# Patient Record
Sex: Female | Born: 1965 | Race: Black or African American | Hispanic: No | Marital: Married | Smoking: Never smoker
Health system: Southern US, Community
[De-identification: ages and names within clinical notes are randomized; demographics above are authoritative.]

## PROBLEM LIST (undated history)

## (undated) HISTORY — PX: SPINE SURGERY: SHX786

## (undated) HISTORY — PX: ABDOMINAL HYSTERECTOMY: SHX81

---

## 2003-11-28 ENCOUNTER — Other Ambulatory Visit: Payer: Self-pay

## 2004-09-21 ENCOUNTER — Ambulatory Visit: Payer: Self-pay | Admitting: Internal Medicine

## 2004-12-22 ENCOUNTER — Ambulatory Visit: Payer: Self-pay | Admitting: Internal Medicine

## 2005-03-26 ENCOUNTER — Ambulatory Visit: Payer: Self-pay | Admitting: Internal Medicine

## 2005-04-08 ENCOUNTER — Ambulatory Visit: Payer: Self-pay

## 2005-11-03 ENCOUNTER — Other Ambulatory Visit: Payer: Self-pay

## 2005-11-03 ENCOUNTER — Emergency Department: Payer: Self-pay | Admitting: Emergency Medicine

## 2005-11-10 ENCOUNTER — Ambulatory Visit: Payer: Self-pay | Admitting: Internal Medicine

## 2006-07-20 ENCOUNTER — Ambulatory Visit: Payer: Self-pay

## 2007-01-21 ENCOUNTER — Ambulatory Visit: Payer: Self-pay | Admitting: Internal Medicine

## 2007-02-14 ENCOUNTER — Emergency Department: Payer: Self-pay | Admitting: Internal Medicine

## 2007-03-17 ENCOUNTER — Emergency Department: Payer: Self-pay | Admitting: Emergency Medicine

## 2007-03-31 ENCOUNTER — Emergency Department: Payer: Self-pay | Admitting: Emergency Medicine

## 2007-04-01 ENCOUNTER — Emergency Department: Payer: Self-pay | Admitting: Emergency Medicine

## 2007-06-22 ENCOUNTER — Ambulatory Visit: Payer: Self-pay | Admitting: Internal Medicine

## 2007-11-03 ENCOUNTER — Ambulatory Visit: Payer: Self-pay | Admitting: Gastroenterology

## 2008-04-05 ENCOUNTER — Ambulatory Visit: Payer: Self-pay | Admitting: Internal Medicine

## 2008-04-14 ENCOUNTER — Ambulatory Visit: Payer: Self-pay | Admitting: Internal Medicine

## 2008-05-05 ENCOUNTER — Emergency Department: Payer: Self-pay | Admitting: Emergency Medicine

## 2008-05-11 ENCOUNTER — Ambulatory Visit: Payer: Self-pay | Admitting: Internal Medicine

## 2008-05-18 ENCOUNTER — Ambulatory Visit: Payer: Self-pay | Admitting: Internal Medicine

## 2008-06-01 ENCOUNTER — Ambulatory Visit: Payer: Self-pay | Admitting: Specialist

## 2008-09-29 ENCOUNTER — Ambulatory Visit: Payer: Self-pay | Admitting: Unknown Physician Specialty

## 2009-02-19 ENCOUNTER — Ambulatory Visit: Payer: Self-pay | Admitting: Internal Medicine

## 2009-09-21 LAB — HM MAMMOGRAPHY: HM Mammogram: NORMAL

## 2009-09-27 ENCOUNTER — Ambulatory Visit: Payer: Self-pay | Admitting: Internal Medicine

## 2009-10-11 ENCOUNTER — Ambulatory Visit: Payer: Self-pay | Admitting: Internal Medicine

## 2010-01-11 ENCOUNTER — Ambulatory Visit: Payer: Self-pay | Admitting: Family

## 2010-07-10 ENCOUNTER — Ambulatory Visit: Payer: Self-pay | Admitting: Internal Medicine

## 2010-10-25 ENCOUNTER — Emergency Department: Payer: Self-pay | Admitting: Emergency Medicine

## 2010-12-05 ENCOUNTER — Ambulatory Visit: Payer: Self-pay | Admitting: Internal Medicine

## 2011-09-25 ENCOUNTER — Telehealth: Payer: Self-pay | Admitting: Internal Medicine

## 2011-09-25 NOTE — Telephone Encounter (Signed)
See below made appointment for Tuesday 10/9 in case is could not be worked in earlier

## 2011-09-25 NOTE — Telephone Encounter (Signed)
Kelly [name] called wanted to be worked in.  Pain on right side kidney infection 2 weeks ago.  Just not feeling well  Please advise pt

## 2011-09-29 ENCOUNTER — Encounter: Payer: Self-pay | Admitting: Internal Medicine

## 2011-09-30 ENCOUNTER — Encounter: Payer: Self-pay | Admitting: Internal Medicine

## 2011-12-05 ENCOUNTER — Ambulatory Visit (INDEPENDENT_AMBULATORY_CARE_PROVIDER_SITE_OTHER): Payer: BC Managed Care – PPO | Admitting: Internal Medicine

## 2011-12-05 ENCOUNTER — Encounter: Payer: Self-pay | Admitting: Internal Medicine

## 2011-12-05 VITALS — BP 122/86 | HR 101 | Temp 98.1°F | Wt 242.0 lb

## 2011-12-05 DIAGNOSIS — J029 Acute pharyngitis, unspecified: Secondary | ICD-10-CM

## 2011-12-05 DIAGNOSIS — N390 Urinary tract infection, site not specified: Secondary | ICD-10-CM

## 2011-12-05 DIAGNOSIS — R52 Pain, unspecified: Secondary | ICD-10-CM

## 2011-12-05 LAB — POCT URINALYSIS DIPSTICK
Nitrite, UA: NEGATIVE
Protein, UA: NEGATIVE
Spec Grav, UA: 1.02
Urobilinogen, UA: 0.2

## 2011-12-05 LAB — POCT RAPID STREP A (OFFICE): Rapid Strep A Screen: NEGATIVE

## 2011-12-05 MED ORDER — CIPROFLOXACIN HCL 500 MG PO TABS: 500.0000 mg | ORAL_TABLET | Freq: Two times a day (BID) | ORAL | Status: AC

## 2011-12-05 NOTE — Progress Notes (Signed)
  Subjective:    Patient ID: Kelly [name], female    DOB: Dec 19, 1966, 45 y.o.   MRN: [id number]  HPI 45YO female presents for acute visit. Complains of several days of urinary frequency, urgency, hematuria, chills. Symptoms similar to previous UTI. Denies fever, flank pain.  Also notes 2-3 days of sore throat, nasal congestion, myalgia. 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 No fever, cough, dyspnea. Has not tried any medication for this.  Outpatient Encounter Prescriptions as of 12/05/2011  Medication Sig Dispense Refill  . lisinopril-hydrochlorothiazide (PRINZIDE,ZESTORETIC) 20-25 MG per tablet Take 1 tablet by mouth daily.        . meloxicam (MOBIC) 15 MG tablet Take 15 mg by mouth daily as needed.        Marland Kitchen spironolactone (ALDACTONE) 50 MG tablet Take 50 mg by mouth daily as needed.        . ciprofloxacin (CIPRO) 500 MG tablet Take 1 tablet (500 mg total) by mouth 2 (two) times daily.  28 tablet  0  . fluticasone (FLONASE) 50 MCG/ACT nasal spray Place 2 sprays into the nose daily.          Review of Systems  Constitutional: Positive for chills and fatigue. Negative for fever.  HENT: Positive for congestion and sore throat.   Respiratory: Negative for cough and shortness of breath.   Cardiovascular: Negative for chest pain.  Gastrointestinal: Negative for abdominal pain.  Genitourinary: Positive for dysuria, urgency, frequency and hematuria.  Musculoskeletal: Positive for myalgias.   BP 122/86  Pulse 101  Temp(Src) 98.1 F (36.7 C) (Oral)  Wt 242 lb (109.77 kg)  SpO2 97%     Objective:   Physical Exam  Constitutional: She is oriented to person, place, and time. She appears well-developed and well-nourished. No distress.  HENT:  Head: Normocephalic and atraumatic.  Right Ear: External ear normal.  Left Ear: External ear normal.  Nose: Nose normal.  Mouth/Throat: Oropharynx is clear and moist. No oropharyngeal exudate.  Eyes:  Conjunctivae are normal. Pupils are equal, round, and reactive to light. Right eye exhibits no discharge. Left eye exhibits no discharge. No scleral icterus.  Neck: Normal range of motion. Neck supple. No tracheal deviation present. No thyromegaly present.  Cardiovascular: Normal rate, regular rhythm, normal heart sounds and intact distal pulses.  Exam reveals no gallop and no friction rub.   No murmur heard. Pulmonary/Chest: Effort normal and breath sounds normal. No respiratory distress. She has no wheezes. She has no rales. She exhibits no tenderness.  Abdominal: There is CVA tenderness (right).  Musculoskeletal: Normal range of motion. She exhibits no edema and no tenderness.  Lymphadenopathy:    She has no cervical adenopathy.  Neurological: She is alert and oriented to person, place, and time. No cranial nerve deficit. She exhibits normal muscle tone. Coordination normal.  Skin: Skin is warm and dry. No rash noted. She is not diaphoretic. No erythema. No pallor.  Psychiatric: She has a normal mood and affect. Her behavior is normal. Judgment and thought content normal.          Assessment & Plan:  1. Urinary tract infection - UA pos for blood. Will send urine for culture. Start cipro 500mg  po bid and pyridium. Follow up if no improvement next 24 hr.  2. Sore Throat - Rapid strep neg. Flu neg. Suspect early viral URI. Will treat symptoms with prn tylenol and ibuprofen. Follow up if symptoms not improving.

## 2011-12-08 LAB — URINE CULTURE: Colony Count: 45000

## 2011-12-10 ENCOUNTER — Other Ambulatory Visit (INDEPENDENT_AMBULATORY_CARE_PROVIDER_SITE_OTHER): Payer: BC Managed Care – PPO | Admitting: *Deleted

## 2011-12-10 DIAGNOSIS — B958 Unspecified staphylococcus as the cause of diseases classified elsewhere: Secondary | ICD-10-CM

## 2011-12-10 DIAGNOSIS — N390 Urinary tract infection, site not specified: Secondary | ICD-10-CM

## 2011-12-10 LAB — POCT URINALYSIS DIPSTICK
Glucose, UA: NEGATIVE
Leukocytes, UA: NEGATIVE
Nitrite, UA: NEGATIVE
Urobilinogen, UA: 0.2

## 2011-12-11 LAB — CBC WITH DIFFERENTIAL/PLATELET
Eosinophils Absolute: 0.1 10*3/uL (ref 0.0–0.7)
Hemoglobin: 12.9 g/dL (ref 12.0–15.0)
Lymphocytes Relative: 36 % (ref 12–46)
Lymphs Abs: 2.8 10*3/uL (ref 0.7–4.0)
Neutro Abs: 4.5 10*3/uL (ref 1.7–7.7)
Neutrophils Relative %: 59 % (ref 43–77)
Platelets: 195 10*3/uL (ref 150–400)
RBC: 4.45 MIL/uL (ref 3.87–5.11)
WBC: 7.6 10*3/uL (ref 4.0–10.5)

## 2011-12-12 LAB — URINE CULTURE: Organism ID, Bacteria: NO GROWTH

## 2011-12-17 LAB — CULTURE, BLOOD (SINGLE): Organism ID, Bacteria: NO GROWTH

## 2011-12-30 ENCOUNTER — Encounter: Payer: Self-pay | Admitting: Internal Medicine

## 2011-12-30 ENCOUNTER — Ambulatory Visit (INDEPENDENT_AMBULATORY_CARE_PROVIDER_SITE_OTHER): Payer: BC Managed Care – PPO | Admitting: Internal Medicine

## 2011-12-30 VITALS — BP 142/100 | HR 80 | Temp 98.6°F | Wt 243.0 lb

## 2011-12-30 DIAGNOSIS — E669 Obesity, unspecified: Secondary | ICD-10-CM

## 2011-12-30 DIAGNOSIS — M545 Low back pain, unspecified: Secondary | ICD-10-CM

## 2011-12-30 DIAGNOSIS — M549 Dorsalgia, unspecified: Secondary | ICD-10-CM

## 2011-12-30 MED ORDER — TRAMADOL HCL 50 MG PO TABS: 50.0000 mg | ORAL_TABLET | Freq: Four times a day (QID) | ORAL | Status: DC | PRN

## 2011-12-30 MED ORDER — HYDROCODONE-ACETAMINOPHEN 10-660 MG PO TABS: 1.0000 | ORAL_TABLET | Freq: Every evening | ORAL | Status: DC | PRN

## 2011-12-30 MED ORDER — PREDNISONE (PAK) 10 MG PO TABS: ORAL_TABLET | ORAL | Status: DC

## 2011-12-30 NOTE — Progress Notes (Signed)
Subjective:    Patient ID: Kelly [name], female    DOB: 21-Jan-1966, 46 y.o.   MRN: [id number]  HPI  Kelly [name] is a 46 yo AA female with a history of hip pain that occurred last summer after a change in physical activity who presents with sudden onset of back pain since last FRiday after no particularly new activities , travel or new shoes.  The pain is in her lower back and radiates to both thighs to the knees but not below.  She has been alternating between use of mobic and tynelol alternating w/o much relief.  She notes tailbone numbness but no saddle numbness or stool/fecal  incontinence.  Her previous episode of pain in July resulted in an Orthopedics evaluation (she saw Dedra Skeens at [facility] ) who opined after obtaining hip and lumbar spine films  that her left hip pain was referred from her lower back . Pain is aggravated by prlonged standing and sitting.  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  No prior PT.     Review of Systems  Constitutional: Negative for fever, chills, appetite change, fatigue and unexpected weight change.  HENT: Negative for hearing loss, ear pain, nosebleeds, congestion, sore throat, facial swelling, rhinorrhea, sneezing, mouth sores, trouble swallowing, neck pain, neck stiffness, voice change, postnasal drip, sinus pressure, tinnitus and ear discharge.   Eyes: Negative for pain, discharge, redness and visual disturbance.  Respiratory: Negative for cough, chest tightness, shortness of breath, wheezing and stridor.   Cardiovascular: Negative for chest pain, palpitations and leg swelling.  Gastrointestinal: Negative for nausea, vomiting, abdominal pain, diarrhea, constipation, blood in stool, abdominal distention and anal bleeding.  Genitourinary: Negative for dysuria and flank pain.  Musculoskeletal: Positive for back pain. Negative for myalgias, arthralgias and gait problem.  Skin: Negative for color change  and rash.  Neurological: Negative for dizziness, weakness, light-headedness and headaches.  Hematological: Negative for adenopathy. Does not bruise/bleed easily.  Psychiatric/Behavioral: Negative for suicidal ideas, sleep disturbance and dysphoric mood. The patient is not nervous/anxious.        Objective:   Physical Exam  Constitutional: She is oriented to person, place, and time. She appears well-developed and well-nourished.  HENT:  Mouth/Throat: Oropharynx is clear and moist.  Eyes: EOM are normal. Pupils are equal, round, and reactive to light. No scleral icterus.  Neck: Normal range of motion. Neck supple. No JVD present. No thyromegaly present.  Cardiovascular: Normal rate, regular rhythm, normal heart sounds and intact distal pulses.   Pulmonary/Chest: Effort normal and breath sounds normal.  Abdominal: Soft. Bowel sounds are normal. She exhibits no mass. There is no tenderness.  Musculoskeletal: Normal range of motion. She exhibits no edema.  Lymphadenopathy:    She has no cervical adenopathy.  Neurological: She is alert and oriented to person, place, and time. She has normal strength. She displays abnormal reflex. No cranial nerve deficit or sensory deficit. She displays a negative Romberg sign. She displays no Babinski's sign on the right side. She displays no Babinski's sign on the left side.  Reflex Scores:      Patellar reflexes are 3+ on the right side and 3+ on the left side.      Achilles reflexes are 1+ on the right side and 1+ on the left side.      Normal straight leg lift bilateral except for tightness in the hamstrings  Skin: Skin is warm and dry.  Psychiatric: She has  a normal mood and affect.          Assessment & Plan:

## 2011-12-30 NOTE — Patient Instructions (Signed)
I am prescribing tramadol 50 mg every 6 hours for daytime use ,  Suspend  the meloxicam for one week  But continue tylenol .    tyllnol  Should be limited to 2000 mg daily   Use the vicodin   In the evening.  Prednisone taper  60 mg then 50 mg then 40 mg , etc  Physical therapy ordered .

## 2011-12-31 ENCOUNTER — Encounter: Payer: Self-pay | Admitting: Internal Medicine

## 2011-12-31 DIAGNOSIS — E669 Obesity, unspecified: Secondary | ICD-10-CM | POA: Insufficient documentation

## 2011-12-31 DIAGNOSIS — M545 Low back pain, unspecified: Secondary | ICD-10-CM | POA: Insufficient documentation

## 2011-12-31 DIAGNOSIS — G8929 Other chronic pain: Secondary | ICD-10-CM | POA: Insufficient documentation

## 2011-12-31 NOTE — Assessment & Plan Note (Signed)
Her exam is not consistent with sciatica and does not warrant MRI at this time.  Will treat for strain with prednisone, MR and analgesics and initiate PT referral .  If no improvement in one week with meds and alteration of duties (work note written),  will consider MRI but will need to get prior films reports from [place].

## 2012-01-06 ENCOUNTER — Encounter: Payer: Self-pay | Admitting: Internal Medicine

## 2012-01-06 ENCOUNTER — Other Ambulatory Visit: Payer: Self-pay | Admitting: Internal Medicine

## 2012-01-06 ENCOUNTER — Telehealth: Payer: Self-pay | Admitting: *Deleted

## 2012-01-06 NOTE — Telephone Encounter (Signed)
I would like to see her tomorrow. Can you put her on the schdeule

## 2012-01-06 NOTE — Telephone Encounter (Signed)
Appt made for tomorrow.

## 2012-01-06 NOTE — Telephone Encounter (Signed)
Pt states that her back is not any better and she would like to have her work leave extended one more week, she is supposed to go back tomorrow but needs more time.

## 2012-01-06 NOTE — Telephone Encounter (Signed)
Left message on machine for pt to call back.

## 2012-01-07 ENCOUNTER — Ambulatory Visit (INDEPENDENT_AMBULATORY_CARE_PROVIDER_SITE_OTHER): Payer: BC Managed Care – PPO | Admitting: Internal Medicine

## 2012-01-07 ENCOUNTER — Encounter: Payer: Self-pay | Admitting: Internal Medicine

## 2012-01-07 ENCOUNTER — Telehealth: Payer: Self-pay | Admitting: *Deleted

## 2012-01-07 VITALS — BP 126/90 | HR 84 | Temp 98.4°F | Wt 242.0 lb

## 2012-01-07 DIAGNOSIS — M5430 Sciatica, unspecified side: Secondary | ICD-10-CM

## 2012-01-07 DIAGNOSIS — M545 Low back pain, unspecified: Secondary | ICD-10-CM

## 2012-01-07 DIAGNOSIS — M5440 Lumbago with sciatica, unspecified side: Secondary | ICD-10-CM

## 2012-01-07 DIAGNOSIS — M5432 Sciatica, left side: Secondary | ICD-10-CM

## 2012-01-07 MED ORDER — PREDNISONE (PAK) 10 MG PO TABS: ORAL_TABLET | ORAL | Status: AC

## 2012-01-07 MED ORDER — HYDROCODONE-ACETAMINOPHEN 10-660 MG PO TABS: 1.0000 | ORAL_TABLET | Freq: Every evening | ORAL | Status: DC | PRN

## 2012-01-07 MED ORDER — HYDROCODONE-ACETAMINOPHEN 10-660 MG PO TABS: 1.0000 | ORAL_TABLET | Freq: Three times a day (TID) | ORAL | Status: DC | PRN

## 2012-01-07 MED ORDER — TRAMADOL HCL 50 MG PO TABS: 100.0000 mg | ORAL_TABLET | Freq: Four times a day (QID) | ORAL | Status: AC | PRN

## 2012-01-07 NOTE — Telephone Encounter (Signed)
[facility] called, they dont have vicodin 10/660 in stock and are asking if ok to change to 10/650.  OK per Dr. Darrick Huntsman.

## 2012-01-07 NOTE — Progress Notes (Signed)
Subjective:    Patient ID: Kelly [name], female    DOB: [birth year], 46 y.o.   MRN: [id number]  HPI  Kelly [name] returns for follow up on low back pain radiating to left buttock and thigh.  She has stayed out of work abut has been moving around taking medications for the past week and pain is no longer radiating down her leg but still present and radiating to the left thigh,  She has started physical therapy.   Her vicodin prescription was apparently never called in hor her so she has been in considerable pain.  She describes her pain level now as 8/10. 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   Past Medical History  Diagnosis Date  . Anemia     severe due to menorrhagia, s/p hysterectomy  . Chest pain 2002    abnormal EKG, negative treadmill stress test/Holter myoview, Dr. Welton Flakes  . Obesity   . Obstructive sleep apnea 2009    mild, wears CPAP  . Preeclampsia     delivered twins at 35 weeks   Current Outpatient Prescriptions on File Prior to Visit  Medication Sig Dispense Refill  . fluticasone (FLONASE) 50 MCG/ACT nasal spray Place 2 sprays into the nose daily.        Marland Kitchen lisinopril-hydrochlorothiazide (PRINZIDE,ZESTORETIC) 20-25 MG per tablet TAKE 1 TABLET BY MOUTH EVERY DAY BLOOD PRESSURE  30 tablet  2  . meloxicam (MOBIC) 15 MG tablet Take 15 mg by mouth daily as needed.        Marland Kitchen spironolactone (ALDACTONE) 50 MG tablet Take 50 mg by mouth daily as needed.          Review of Systems  Constitutional: Negative for fever, chills, appetite change, fatigue and unexpected weight change.  HENT: Negative for hearing loss, ear pain, nosebleeds, congestion, sore throat, facial swelling, rhinorrhea, sneezing, mouth sores, trouble swallowing, neck pain, neck stiffness, voice change, postnasal drip, sinus pressure, tinnitus and ear discharge.   Eyes: Negative for pain, discharge, redness and visual disturbance.  Respiratory: Negative for  cough, chest tightness, shortness of breath, wheezing and stridor.   Cardiovascular: Negative for chest pain, palpitations and leg swelling.  Gastrointestinal: Negative for nausea, vomiting, abdominal pain, diarrhea, constipation, blood in stool, abdominal distention and anal bleeding.  Genitourinary: Negative for dysuria and flank pain.  Musculoskeletal: Positive for back pain. Negative for myalgias, arthralgias and gait problem.  Skin: Negative for color change and rash.  Neurological: Negative for dizziness, weakness, light-headedness and headaches.  Hematological: Negative for adenopathy. Does not bruise/bleed easily.  Psychiatric/Behavioral: Negative for suicidal ideas, sleep disturbance and dysphoric mood. The patient is not nervous/anxious.    BP 126/90  Pulse 84  Temp(Src) 98.4 F (36.9 C) (Oral)  Wt 242 lb (109.77 kg)  SpO2 99%     Objective:   Physical Exam  Constitutional: She is oriented to person, place, and time. She appears well-developed and well-nourished.  HENT:  Mouth/Throat: Oropharynx is clear and moist.  Eyes: EOM are normal. Pupils are equal, round, and reactive to light. No scleral icterus.  Neck: Normal range of motion. Neck supple. No JVD present. No thyromegaly present.  Cardiovascular: Normal rate, regular rhythm, normal heart sounds and intact distal pulses.   Pulmonary/Chest: Effort normal and breath sounds normal.  Abdominal: Soft. Bowel sounds are normal. She exhibits no mass. There is no tenderness.  Musculoskeletal: Normal range of motion. She exhibits no edema.  Lymphadenopathy:  She has no cervical adenopathy.  Neurological: She is alert and oriented to person, place, and time. She has normal strength. She displays abnormal reflex. No cranial nerve deficit or sensory deficit. She displays a negative Romberg sign. She displays no Babinski's sign on the right side. She displays no Babinski's sign on the left side.  Reflex Scores:      Patellar  reflexes are 3+ on the right side and 3+ on the left side.      Achilles reflexes are 1+ on the right side and 1+ on the left side.      Normal straight leg lift bilateral except for tightness in the hamstrings  Skin: Skin is warm and dry.  Psychiatric: She has a normal mood and affect.       Assessment & Plan:

## 2012-01-07 NOTE — Patient Instructions (Signed)
Your urine and blood tests were all normal   I am calling in ES vicodin for the next few weeks,  You may use up to 3 daily,  And tramadol to use when your are driving ,  Up to 161 mg at a time on the tramadol.  We will also repeat the prednisone taper.,

## 2012-01-08 DIAGNOSIS — M5432 Sciatica, left side: Secondary | ICD-10-CM | POA: Insufficient documentation

## 2012-01-08 NOTE — Assessment & Plan Note (Signed)
With mild improvement following a course of prednisone with regard to radicular symptoms. 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  Continue PT,  repeat prednisone taper,  Add vicodin for severe or nighttime pain and continue tramadol for daytime pain .

## 2012-01-12 ENCOUNTER — Ambulatory Visit: Payer: BC Managed Care – PPO | Admitting: Internal Medicine

## 2012-01-14 ENCOUNTER — Encounter: Payer: Self-pay | Admitting: Internal Medicine

## 2012-01-19 ENCOUNTER — Telehealth: Payer: Self-pay | Admitting: Internal Medicine

## 2012-01-19 NOTE — Telephone Encounter (Signed)
LMOM for patient to call back.

## 2012-01-19 NOTE — Telephone Encounter (Signed)
Patient is needing a note to return to work.

## 2012-01-20 NOTE — Telephone Encounter (Signed)
Pt is asking for note to go back to work.  She went back on 1/24, so needs note to have that date.  Please call when ready, she is ok to wait until Thursday.  Please call her on fax number [phone].

## 2012-01-23 ENCOUNTER — Encounter: Payer: Self-pay | Admitting: Internal Medicine

## 2012-03-12 NOTE — Telephone Encounter (Signed)
Morrie Sheldon, can you check on this?  Thanks.

## 2012-03-12 NOTE — Telephone Encounter (Signed)
This has been taken care of patient go the note when she had requested it.

## 2012-03-25 ENCOUNTER — Telehealth: Payer: Self-pay | Admitting: *Deleted

## 2012-03-25 ENCOUNTER — Emergency Department: Payer: Self-pay | Admitting: *Deleted

## 2012-03-25 ENCOUNTER — Telehealth: Payer: Self-pay | Admitting: Internal Medicine

## 2012-03-25 LAB — COMPREHENSIVE METABOLIC PANEL
Alkaline Phosphatase: 61 U/L (ref 50–136)
BUN: 8 mg/dL (ref 7–18)
Calcium, Total: 8.8 mg/dL (ref 8.5–10.1)
Chloride: 104 mmol/L (ref 98–107)
Co2: 27 mmol/L (ref 21–32)
Creatinine: 0.65 mg/dL (ref 0.60–1.30)
EGFR (African American): >60
Glucose: 98 mg/dL (ref 65–99)
Osmolality: 280 (ref 275–301)
SGPT (ALT): 25 U/L
Sodium: 141 mmol/L (ref 136–145)

## 2012-03-25 LAB — CBC
MCHC: 33.4 g/dL (ref 32.0–36.0)
MCV: 87 fL (ref 80–100)
WBC: 6 10*3/uL (ref 3.6–11.0)

## 2012-03-25 LAB — LIPASE, BLOOD: Lipase: 125 U/L (ref 73–393)

## 2012-03-25 NOTE — Telephone Encounter (Signed)
[facility] Triage Call Report Triage Record Num: [id number] Operator: Patriciaann Clan Patient Name: Kelly [name] Call Date & Time: 03/25/2012 10:51:23AM Patient Phone: [phone] PCP: Duncan Dull Patient Gender: Female PCP Fax : [phone] Patient DOB: 1966/10/27 Practice Name: [facility] Reason for Call: Caller: [name]/Patient; PCP: Duncan Dull; CB#: [phone]; LMP- hysterectomy Patient states she developed "heart burn." Onset X 1 month. States sx have increased X 1 week. States has been using OTC Maalox. States, "even drinking water hurts." C/o nausea. No vomiting or diarrhea. Afebrile. Denies chest pain or shortness of breath. States burning/pain in upper abdomen radiating to RUQ of abdomen. States abdominal pain started 03/21/12. States pain increases after eating. States pain is currently at "8" on 1-10 scale and describes as "sharp, stabbing." Patient also describes pain as "tearing." Triage per Abdominal Pain Protocol. No appts. available in office. Care advice given per guidelines. Patient advised NPO, advised to be seen in ED for evaluation now. Patient advised not to drive self. Patient verbalizes understanding and agreeable. Patient states she will go to [facility] ED for evaluation. Sarah RN, in office, notified of above. Protocol(s) Used: Abdominal Pain Protocol(s) Used: Heartburn Recommended Outcome per Protocol: See ED Immediately Reason for Outcome: Abdominal pain Pain described as deep, boring, or tearing Care Advice: ~ Another adult should drive. ~ Do not give the patient anything to eat or drink. ~ Do not push on abdomen. ~ IMMEDIATE ACTION Write down provider's name. List or place the following in a bag for transport with the patient: current prescription and/or nonprescription medications; alternative treatments, therapies and medications; and street drugs. ~ 03/25/2012 11:23:52AM Page 1 of 1 [id number]  I seen  another note in patients chart that Maralyn Sago spoke with [facility] and patient is going to ER.

## 2012-03-25 NOTE — Telephone Encounter (Signed)
Spoke w/[facility] - FYI, they sent pt to the ER. She is c/o severe (8 out of 10) "tearing" pain in RUQ & epigastric region w/nausea. No vomiting or fever. Symptoms increasing over the last week, worse w/food and now can not tolerate liquids. I agreed with ER due to severe pain and no open apts at this time.

## 2012-03-25 NOTE — Telephone Encounter (Signed)
Caller: Kylyn/Patient; PCP: Duncan Dull; CB#: [phone]; LMP- hysterectomy  Patient states she developed "heart burn." Onset X 1 month. States sx have increased X 1 week. States has been using OTC Maalox. States, "even drinking water hurts." C/o nausea.  No vomiting or diarrhea. Afebrile. Denies chest pain or shortness of breath. States burning/pain  in upper abdomen radiating to RUQ of abdomen. States abdominal pain started 03/21/12. States pain increases after eating. States pain is currently at "8" on 1-10 scale and describes as "sharp, stabbing."  Patient also describes pain as "tearing."  Triage per Abdominal Pain Protocol. No appts. available in office. Care advice given per guidelines. Patient advised NPO, advised to be seen in ED for evaluation now. Patient advised not to  drive self. Patient verbalizes understanding and agreeable. Patient states she will go to [facility] ED for evaluation. Sarah RN, in office, notified of above.

## 2012-06-25 ENCOUNTER — Other Ambulatory Visit: Payer: Self-pay | Admitting: Internal Medicine

## 2012-07-22 ENCOUNTER — Telehealth: Payer: Self-pay | Admitting: Internal Medicine

## 2012-07-22 NOTE — Telephone Encounter (Signed)
Caller: Kelly [name]/Patient; PCP: Duncan Dull; CB#: [phone]; ; ; Call regarding Headache. Eyes Swollen, Nose Irritation;  Afebrile. Onset- 2 weeks for eye s/s and  1 week for headache per pt. Pt c/o of headache and redness, tenderness, swelling at the corner of her eye. She reports it looks like a sore or scabbing in the area.  She also has tearing and sensitivity to light. Emergent s/s of Headache and Eye infection or irritation protocol r/o. Pt to see provider within 4hrs. No appts seen in [facility], message sent.

## 2012-07-22 NOTE — Telephone Encounter (Signed)
Patient notified

## 2012-07-22 NOTE — Telephone Encounter (Signed)
Unless dr walker can see her or if i have room tomorrow , she needs to go to urgent care.

## 2012-07-27 ENCOUNTER — Ambulatory Visit (INDEPENDENT_AMBULATORY_CARE_PROVIDER_SITE_OTHER): Payer: BC Managed Care – PPO | Admitting: Internal Medicine

## 2012-07-27 ENCOUNTER — Encounter: Payer: Self-pay | Admitting: Internal Medicine

## 2012-07-27 VITALS — BP 136/84 | HR 100 | Temp 98.9°F | Resp 16 | Wt 253.5 lb

## 2012-07-27 DIAGNOSIS — F339 Major depressive disorder, recurrent, unspecified: Secondary | ICD-10-CM | POA: Insufficient documentation

## 2012-07-27 DIAGNOSIS — R635 Abnormal weight gain: Secondary | ICD-10-CM

## 2012-07-27 DIAGNOSIS — H1045 Other chronic allergic conjunctivitis: Secondary | ICD-10-CM

## 2012-07-27 DIAGNOSIS — G43009 Migraine without aura, not intractable, without status migrainosus: Secondary | ICD-10-CM

## 2012-07-27 DIAGNOSIS — F329 Major depressive disorder, single episode, unspecified: Secondary | ICD-10-CM

## 2012-07-27 DIAGNOSIS — R5383 Other fatigue: Secondary | ICD-10-CM

## 2012-07-27 DIAGNOSIS — F32A Depression, unspecified: Secondary | ICD-10-CM

## 2012-07-27 DIAGNOSIS — E669 Obesity, unspecified: Secondary | ICD-10-CM

## 2012-07-27 DIAGNOSIS — F3289 Other specified depressive episodes: Secondary | ICD-10-CM

## 2012-07-27 DIAGNOSIS — R5381 Other malaise: Secondary | ICD-10-CM

## 2012-07-27 DIAGNOSIS — H1013 Acute atopic conjunctivitis, bilateral: Secondary | ICD-10-CM

## 2012-07-27 LAB — COMPREHENSIVE METABOLIC PANEL
Alkaline Phosphatase: 51 U/L (ref 39–117)
BUN: 11 mg/dL (ref 6–23)
CO2: 24 mEq/L (ref 19–32)
Creatinine, Ser: 0.5 mg/dL (ref 0.4–1.2)
GFR: 174.84 mL/min (ref >60.00)
Glucose, Bld: 111 mg/dL — ABNORMAL HIGH (ref 70–99)
Total Bilirubin: 0.5 mg/dL (ref 0.3–1.2)
Total Protein: 7.5 g/dL (ref 6.0–8.3)

## 2012-07-27 LAB — CBC WITH DIFFERENTIAL/PLATELET
Basophils Relative: 0.3 % (ref 0.0–3.0)
Eosinophils Relative: 1.2 % (ref 0.0–5.0)
HCT: 38 % (ref 36.0–46.0)
Hemoglobin: 12.7 g/dL (ref 12.0–15.0)
Lymphs Abs: 2.7 10*3/uL (ref 0.7–4.0)
MCV: 87.5 fl (ref 78.0–100.0)
Monocytes Absolute: 0.4 10*3/uL (ref 0.1–1.0)
Monocytes Relative: 6 % (ref 3.0–12.0)
Neutro Abs: 4 10*3/uL (ref 1.4–7.7)
Platelets: 168 10*3/uL (ref 150.0–400.0)
WBC: 7.2 10*3/uL (ref 4.5–10.5)

## 2012-07-27 LAB — LIPID PANEL
Cholesterol: 186 mg/dL (ref 0–200)
VLDL: 36.6 mg/dL (ref 0.0–40.0)

## 2012-07-27 LAB — TSH: TSH: 1.05 u[IU]/mL (ref 0.35–5.50)

## 2012-07-27 MED ORDER — NABUMETONE 750 MG PO TABS: 750.0000 mg | ORAL_TABLET | Freq: Two times a day (BID) | ORAL | Status: DC

## 2012-07-27 MED ORDER — CITALOPRAM HYDROBROMIDE 20 MG PO TABS: 20.0000 mg | ORAL_TABLET | Freq: Every day | ORAL | Status: DC

## 2012-07-27 NOTE — Assessment & Plan Note (Signed)
Trouble with anxiety in this insomnia aggravated by the loss of her daughter to college and her frustration with her weight. Discussed trial of citalopram and return in one month.

## 2012-07-27 NOTE — Assessment & Plan Note (Signed)
Likely aggravated by depression and insomnia. We have elected not to start any new medications other than the citalopram and an anti-inflammatory for her muscle aches.

## 2012-07-27 NOTE — Progress Notes (Signed)
Patient ID: Kelly [name], female   DOB: December 31, 1965, 46 y.o.   MRN: [id number]  Patient Active Problem List  Diagnosis  . Obesity (BMI 30-39.9)  . Sciatica of left side  . Depression (emotion)  . Migraine headache without aura  . Allergic conjunctivitis of both eyes    Subjective:  CC:   Chief Complaint  Patient presents with  . Facial Swelling  . Headache    HPI:   Kelly [name] a 46 y.o. female who presents with 1) Weight gain, despite eating frequent smaller meals . Husband sabotaging her attempts to lose weight by bringing home donuts .  Kneesand  hips ache with exercise, but has not tried swimming or water aerobics yet.  She is also reporting symptoms of muscle pain if grabbed.  2) Tearful,  dtr just went to college and she is having empry nest syndrome.  Mood swings from happy to sad.  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  Her migraines Started 10 yrs ago after hysterectomy but weret infrequent up until 2 months ago. She denies visual changes blurred vision nausea neck pain fevers and chills. 4) bilateral itching of both eyes without discharge or vision changes.   Past Medical History  Diagnosis Date  . Anemia     severe due to menorrhagia, s/p hysterectomy  . Chest pain 2002    abnormal EKG, negative treadmill stress test/Holter myoview, Dr. Welton Flakes  . Obesity   . Obstructive sleep apnea 2009    mild, wears CPAP  . Preeclampsia     delivered twins at 35 weeks    Past Surgical History  Procedure Date  . Abdominal hysterectomy 35    secondary to post partum hemorrhage causing anemia         The following portions of the patient's history were reviewed and updated as appropriate: Allergies, current medications, and problem list.    Review of Systems:   12 Pt  review of systems was negative except those addressed in the HPI,     History   Social History  . Marital Status:  Married    Spouse Name: N/A    Number of Children: N/A  . Years of Education: N/A   Occupational History  . Part-time     Midwife   Social History Main Topics  . Smoking status: Never Smoker   . Smokeless tobacco: Never Used  . Alcohol Use: Yes     occasional  . Drug Use: No  . Sexually Active: Not on file   Other Topics Concern  . Not on file   Social History Narrative  . No narrative on file    Objective:  BP 136/84  Pulse 100  Temp 98.9 F (37.2 C) (Oral)  Resp 16  Wt 253 lb 8 oz (114.987 kg)  SpO2 98%  General appearance: alert, cooperative and appears stated age Ears: normal TM's and external ear canals both ears Throat: lips, mucosa, and tongue normal; teeth and gums normal Neck: no adenopathy, no carotid bruit, supple, symmetrical, trachea midline and thyroid not enlarged, symmetric, no tenderness/mass/nodules Back: symmetric, no curvature. ROM normal. No CVA tenderness. Lungs: clear to auscultation bilaterally Heart: regular rate and rhythm, S1, S2 normal, no murmur, click, rub or gallop Abdomen: soft, non-tender; bowel sounds normal; no masses,  no organomegaly Pulses: 2+ and symmetric Skin: Skin color, texture, turgor normal. No rashes or lesions Lymph nodes: Cervical, supraclavicular, and axillary nodes  normal.  Assessment and Plan:  Depression (emotion) Trouble with anxiety in this insomnia aggravated by the loss of her daughter to college and her frustration with her weight. Discussed trial of citalopram and return in one month.  Obesity (BMI 30-39.9) I have addressed  BMI and recommended a low glycemic index diet utilizing smaller more frequent meals to increase metabolism.  I have also recommended that patient start exercising with a goal of 30 minutes of aerobic exercise a minimum of 5 days per week. Screening for lipid disorders, thyroid and diabetes to be done prior to next visit in one month   Migraine headache without aura Likely  aggravated by depression and insomnia. We have elected not to start any new medications other than the citalopram and an anti-inflammatory for her muscle aches.  Allergic conjunctivitis of both eyes Discussed initial trial of Zyrtec or Allegra. If her symptoms are not lead relieved with over-the-counter antihistamines we will try Pataday drops   Updated Medication List Outpatient Encounter Prescriptions as of 07/27/2012  Medication Sig Dispense Refill  . lisinopril-hydrochlorothiazide (PRINZIDE,ZESTORETIC) 20-25 MG per tablet TAKE 1 TABLET BY MOUTH EVERY DAY BLOOD PRESSURE  30 tablet  3  . DISCONTD: meloxicam (MOBIC) 15 MG tablet Take 15 mg by mouth daily as needed.        . citalopram (CELEXA) 20 MG tablet Take 1 tablet (20 mg total) by mouth daily.  30 tablet  3  . nabumetone (RELAFEN) 750 MG tablet Take 1 tablet (750 mg total) by mouth 2 (two) times daily.  60 tablet  1  . DISCONTD: fluticasone (FLONASE) 50 MCG/ACT nasal spray Place 2 sprays into the nose daily.        Marland Kitchen DISCONTD: Hydrocodone-Acetaminophen 10-660 MG TABS Take 1 tablet by mouth 3 (three) times daily as needed.  90 each  0  . DISCONTD: spironolactone (ALDACTONE) 50 MG tablet Take 50 mg by mouth daily as needed.           Orders Placed This Encounter  Procedures  . Lipid panel  . TSH  . CBC with Differential  . Comprehensive metabolic panel    Return in about 1 month (around 08/27/2012).

## 2012-07-27 NOTE — Patient Instructions (Addendum)
Try Allegra or xyrtec for the itchy eyes,.  If no results, call for eye drops   I am changing your melxociam to nabumetone,  Take twice daily as needed for pain.  [facility] consider [facility] to use [facility] for exercise.   I am starting you on citlaopram for depression and anxiety .  Start with 1/2 tablet daily with dinner ,  Increase to whole tablet after one week.  Try citalopram for the insomnia. If no improvement in 1-2 weeks,  Call for ambien and change the citalopram to earlier day dosing.   Consider a Low Glycemic Index Diet and eating 6 smaller meals daily .  This frequent feeding stimulates your metabolism and the lower glycemic index foods will lower your blood sugars:   This is an example of my daily  "Low GI"  Diet:  All of the foods can be found at grocery stores and in bulk at [employer]  club   7 AM Breakfast:  Low carbohydrate Protein  Shakes (I recommend the EAS AdvantEdge "Carb Control" shakes  Or the low carb shakes by Atkins.   Both are available everywhere:  In  cases at [employer]  Or in 4 packs at grocery stores and pharmacies  2.5 carbs  (Alternative is  a toasted Arnold's Sandwhich Thin w/ peanut butter, a "Bagel Thin" with cream cheese and salmon) or  a scrambled egg burrito made with a low carb tortilla .  Avoid cereal and bananas, oatmeal too unless the old fashioned kind that takes 30-40 minutes to prepare.  the rest is overly processed, has minimal fiber, and loaded with carbohydrates!   10 AM: Protein bar by Atkins (the snack size, under 200 cal.  There are many varieties , available widely again or in bulk in limited varieties at [employer])  Other so called "protein bars" tend to be loaded with carbohydrates.  Remember, in food advertising, the word "energy" is synonymous for " carbohydrate."  Lunch: sandwich of [place], (or any lunchmeat or canned tuna), fresh avocado and cheese on a lower carbohydrate pita bread, flatbread, or tortilla . Ok to use mayonnaise. The bread is the only  source or carbohydrate that can be decreased (Joseph's makes a pita bread and a flat bread  Are 50 cal and 4 net carbs ; Toufayan makes a low carb flatbread 100 cal and 9 net carbs ("food Lion)   and  Mission makes a low carb whole wheat tortilla  210 cal and 6 net carbs)  3 PM:  Mid day :  Another protein bar,  Or a  cheese stick (100 cal, 0 carbs),  Or 1 ounce of  almonds, walnuts, pistachios, pecans, peanuts,  Macadamia nuts. Or a Dannon light n Fit greek yogurt, 80 cal 8 net carbs . Avoid "granola"; the dried cranberries and raisins are loaded with carbohydrates.    6 PM  Dinner:  "mean and green:"  Meat/chicken/fish or a high protein legume; , with a green salad, and a low GI  Veggie (broccoli, cauliflower, green beans, spinach, brussel sprouts. Lima beans) : Avoid "Low fat dressings, Reyne Dumas and [address]! They are loaded with sugar! Instead use ranch, vinagrette,  Blue cheese, etc  9 PM snack : Breyer's "low carb" fudgsicle or  ice cream bar (Carb Smart line), or  Weight Watcher's ice cream bar , or anouther "no sugar added" ice cream; or another protein shake or a serving of fresh fruit with whipped cream (Avoid bananas, pineapple, grapes  and  watermelon on a regular basis because they are high in sugar)   Remember that snack Substitutions should be less than 15 to 20 carbs  Per serving. Remember to subtract fiber grams to get the "net carbs."

## 2012-07-27 NOTE — Assessment & Plan Note (Signed)
I have addressed  BMI and recommended a low glycemic index diet utilizing smaller more frequent meals to increase metabolism.  I have also recommended that patient start exercising with a goal of 30 minutes of aerobic exercise a minimum of 5 days per week. Screening for lipid disorders, thyroid and diabetes to be done prior to next visit in one month

## 2012-07-27 NOTE — Assessment & Plan Note (Signed)
Discussed initial trial of Zyrtec or Allegra. If her symptoms are not lead relieved with over-the-counter antihistamines we will try Pataday drops

## 2012-09-13 ENCOUNTER — Ambulatory Visit (INDEPENDENT_AMBULATORY_CARE_PROVIDER_SITE_OTHER): Payer: BC Managed Care – PPO | Admitting: Internal Medicine

## 2012-09-13 ENCOUNTER — Other Ambulatory Visit: Payer: Self-pay | Admitting: Internal Medicine

## 2012-09-13 ENCOUNTER — Encounter: Payer: Self-pay | Admitting: Internal Medicine

## 2012-09-13 VITALS — BP 132/74 | HR 80 | Temp 98.5°F | Ht 67.0 in | Wt 243.2 lb

## 2012-09-13 DIAGNOSIS — F3289 Other specified depressive episodes: Secondary | ICD-10-CM

## 2012-09-13 DIAGNOSIS — N898 Other specified noninflammatory disorders of vagina: Secondary | ICD-10-CM

## 2012-09-13 DIAGNOSIS — F32A Depression, unspecified: Secondary | ICD-10-CM

## 2012-09-13 DIAGNOSIS — E669 Obesity, unspecified: Secondary | ICD-10-CM

## 2012-09-13 DIAGNOSIS — F329 Major depressive disorder, single episode, unspecified: Secondary | ICD-10-CM

## 2012-09-13 DIAGNOSIS — R251 Tremor, unspecified: Secondary | ICD-10-CM

## 2012-09-13 DIAGNOSIS — E2839 Other primary ovarian failure: Secondary | ICD-10-CM

## 2012-09-13 DIAGNOSIS — R259 Unspecified abnormal involuntary movements: Secondary | ICD-10-CM

## 2012-09-13 DIAGNOSIS — E162 Hypoglycemia, unspecified: Secondary | ICD-10-CM

## 2012-09-13 DIAGNOSIS — Z1239 Encounter for other screening for malignant neoplasm of breast: Secondary | ICD-10-CM

## 2012-09-13 DIAGNOSIS — Z0000 Encounter for general adult medical examination without abnormal findings: Secondary | ICD-10-CM

## 2012-09-13 MED ORDER — FLUTICASONE PROPIONATE 50 MCG/ACT NA SUSP: 2.0000 | Freq: Every day | NASAL | Status: DC

## 2012-09-13 MED ORDER — ESCITALOPRAM OXALATE 10 MG PO TABS: 10.0000 mg | ORAL_TABLET | Freq: Every day | ORAL | Status: DC

## 2012-09-13 NOTE — Assessment & Plan Note (Signed)
Improving with adherence to low GI diet .  10 lb wt loss in under 6 weeks. Encouragement given .

## 2012-09-13 NOTE — Patient Instructions (Addendum)
For your hot flashes, we can try changing your citalopram to escitalopram to see if it helps.   If it is too expensive  OR   Does not work on both your depression and your hot flashes.,  We will resume citalopram and use oral hormone therapy with generic estradiol. Let me know   We are checking you for diabetes today because of your epsiodes of tremor, and we are checking your female hormone levels to confirm menopause.  I sent a swab of your vaginal diswcharge and will call in a medication if it is positive for yeast   Mammogram ordered.

## 2012-09-13 NOTE — Assessment & Plan Note (Signed)
Intermittent, unclear if symptoms are due to hypoglycemia.  Checking hgba1c.  If normal  Will need MRI of brain.

## 2012-09-13 NOTE — Assessment & Plan Note (Signed)
Change from celexa to lexapro as a trial to manage concurrent hot flashes.

## 2012-09-13 NOTE — Assessment & Plan Note (Signed)
FSH and LH ordered to confirm.,  Trial of lexapro.

## 2012-09-13 NOTE — Progress Notes (Signed)
Patient ID: Kelly [name], female   DOB: 08/31/1966, 46 y.o.   MRN: [id number] Patient Active Problem List  Diagnosis  . Obesity (BMI 30-39.9)  . Sciatica of left side  . Depression (emotion)  . Migraine headache without aura  . Allergic conjunctivitis of both eyes  . Menopause ovarian failure  . Tremor of unknown origin    Subjective:  CC:   Chief Complaint  Patient presents with  . Annual Exam    HPI:   Kelly [name] a 46 y.o. female who presents for her annual exam.  She is s/p  Hysterectomy, last pelvic exam was 2 years ago.,  Has been having hot flashes for the last several months.  Distressing,  Day and night.  No relief with citalopram.  Has lost 10 lbs. On low gi diet  Eating every 3 hours , still having infrequent episodes of right hand tremor that initially were more frequent and resolved with eating.   No other neurologic symptoms with it.     Past Medical History  Diagnosis Date  . Anemia     severe due to menorrhagia, s/p hysterectomy  . Chest pain 2002    abnormal EKG, negative treadmill stress test/Holter myoview, Dr. Welton Flakes  . Obesity   . Obstructive sleep apnea 2009    mild, wears CPAP  . Preeclampsia     delivered twins at 35 weeks    Past Surgical History  Procedure Date  . Abdominal hysterectomy 35    secondary to post partum hemorrhage causing anemia         The following portions of the patient's history were reviewed and updated as appropriate: Allergies, current medications, and problem list.    Review of Systems:   12 Pt  review of systems was negative except those addressed in the HPI,     History   Social History  . Marital Status: Married    Spouse Name: N/A    Number of Children: N/A  . Years of Education: N/A   Occupational History  . Part-time     Midwife   Social History Main Topics  . Smoking status: Never Smoker   . Smokeless tobacco: Never Used  . Alcohol Use: Yes     occasional  . Drug Use: No    . Sexually Active: Not on file   Other Topics Concern  . Not on file   Social History Narrative  . No narrative on file    Objective:  BP 132/74  Pulse 80  Temp 98.5 F (36.9 C) (Oral)  Ht 5\' 7"  (1.702 m)  Wt 243 lb 4 oz (110.337 kg)  BMI 38.10 kg/m2  BP 132/74  Pulse 80  Temp 98.5 F (36.9 C) (Oral)  Ht 5\' 7"  (1.702 m)  Wt 243 lb 4 oz (110.337 kg)  BMI 38.10 kg/m2  General Appearance:    Alert, cooperative, no distress, appears stated age  Head:    Normocephalic, without obvious abnormality, atraumatic  Eyes:    PERRL, conjunctiva/corneas clear, EOM's intact, fundi    benign, both eyes  Ears:    Normal TM's and external ear canals, both ears  Nose:   Nares normal, septum midline, mucosa normal, no drainage    or sinus tenderness  Throat:   Lips, mucosa, and tongue normal; teeth and gums normal  Neck:   Supple, symmetrical, trachea midline, no adenopathy;    thyroid:  no enlargement/tenderness/nodules; no carotid   bruit or JVD  Back:  Symmetric, no curvature, ROM normal, no CVA tenderness  Lungs:     Clear to auscultation bilaterally, respirations unlabored  Chest Wall:    No tenderness or deformity   Heart:    Regular rate and rhythm, S1 and S2 normal, no murmur, rub   or gallop  Breast Exam:    No tenderness, masses, or nipple abnormality  Abdomen:     Soft, non-tender, bowel sounds active all four quadrants,    no masses, no organomegaly  Genitalia:    Cervix surgically absent,  Thick vaginal discharge present.      Extremities:   Extremities normal, atraumatic, no cyanosis or edema  Pulses:   2+ and symmetric all extremities  Skin:   Skin color, texture, turgor normal, no rashes or lesions  Lymph nodes:   Cervical, supraclavicular, and axillary nodes normal  Neurologic:   CNII-XII intact, normal strength, sensation and reflexes    throughout   Assessment and Plan:  Obesity (BMI 30-39.9) Improving with adherence to low GI diet .  10 lb wt loss in  under 6 weeks. Encouragement given .  Depression (emotion) Change from celexa to lexapro as a trial to manage concurrent hot flashes.   Menopause ovarian failure FSH and LH ordered to confirm.,  Trial of lexapro.  Tremor of unknown origin Intermittent, unclear if symptoms are due to hypoglycemia.  Checking hgba1c.  If normal  Will need MRI of brain.    Updated Medication List Outpatient Encounter Prescriptions as of 09/13/2012  Medication Sig Dispense Refill  . lisinopril-hydrochlorothiazide (PRINZIDE,ZESTORETIC) 20-25 MG per tablet TAKE 1 TABLET BY MOUTH EVERY DAY BLOOD PRESSURE  30 tablet  3  . nabumetone (RELAFEN) 750 MG tablet Take 1 tablet (750 mg total) by mouth 2 (two) times daily.  60 tablet  1  . DISCONTD: citalopram (CELEXA) 20 MG tablet Take 1 tablet (20 mg total) by mouth daily.  30 tablet  3  . escitalopram (LEXAPRO) 10 MG tablet Take 1 tablet (10 mg total) by mouth daily.  30 tablet  3  . fluticasone (FLONASE) 50 MCG/ACT nasal spray Place 2 sprays into the nose daily.  16 g  6  . methocarbamol (ROBAXIN) 750 MG tablet          Orders Placed This Encounter  Procedures  . Wet prep, genital  . Wet prep, genital  . MM Digital Screening  . Hemoglobin A1c  . Comprehensive metabolic panel  . Follicle stimulating hormone  . Luteinizing hormone  . GC/chlamydia probe amp, genital    Return in about 6 months (around 03/13/2013).

## 2012-09-14 ENCOUNTER — Encounter: Payer: Self-pay | Admitting: Internal Medicine

## 2012-09-14 LAB — COMPREHENSIVE METABOLIC PANEL
Albumin: 4.1 g/dL (ref 3.5–5.2)
Alkaline Phosphatase: 49 U/L (ref 39–117)
BUN: 13 mg/dL (ref 6–23)
CO2: 25 mEq/L (ref 19–32)
Calcium: 9 mg/dL (ref 8.4–10.5)
Chloride: 102 mEq/L (ref 96–112)
Glucose, Bld: 102 mg/dL — ABNORMAL HIGH (ref 70–99)
Potassium: 3.5 mEq/L (ref 3.5–5.1)
Sodium: 137 mEq/L (ref 135–145)
Total Protein: 7.7 g/dL (ref 6.0–8.3)

## 2012-09-14 LAB — FOLLICLE STIMULATING HORMONE: FSH: 5.4 m[IU]/mL

## 2012-09-14 LAB — LUTEINIZING HORMONE: LH: 4.4 m[IU]/mL

## 2012-09-15 ENCOUNTER — Ambulatory Visit: Payer: Self-pay | Admitting: Internal Medicine

## 2012-09-16 ENCOUNTER — Encounter: Payer: Self-pay | Admitting: Internal Medicine

## 2012-09-16 MED ORDER — FLUCONAZOLE 150 MG PO TABS: 150.0000 mg | ORAL_TABLET | Freq: Every day | ORAL | Status: DC

## 2012-09-16 NOTE — Addendum Note (Signed)
Addended by: Duncan Dull on: 09/16/2012 03:13 PM   Modules accepted: Orders

## 2012-09-20 ENCOUNTER — Encounter: Payer: Self-pay | Admitting: Internal Medicine

## 2013-01-18 ENCOUNTER — Other Ambulatory Visit: Payer: Self-pay | Admitting: Internal Medicine

## 2013-01-18 NOTE — Telephone Encounter (Signed)
Med filled.  

## 2013-01-19 NOTE — Telephone Encounter (Signed)
Med filled.  

## 2013-02-06 ENCOUNTER — Other Ambulatory Visit: Payer: Self-pay

## 2013-03-14 ENCOUNTER — Encounter: Payer: Self-pay | Admitting: Internal Medicine

## 2013-03-14 ENCOUNTER — Ambulatory Visit (INDEPENDENT_AMBULATORY_CARE_PROVIDER_SITE_OTHER): Payer: BC Managed Care – PPO | Admitting: Internal Medicine

## 2013-03-14 ENCOUNTER — Telehealth: Payer: Self-pay | Admitting: Emergency Medicine

## 2013-03-14 VITALS — BP 106/78 | HR 89 | Temp 98.0°F | Resp 16 | Wt 240.0 lb

## 2013-03-14 DIAGNOSIS — E785 Hyperlipidemia, unspecified: Secondary | ICD-10-CM

## 2013-03-14 DIAGNOSIS — I951 Orthostatic hypotension: Secondary | ICD-10-CM | POA: Insufficient documentation

## 2013-03-14 DIAGNOSIS — E876 Hypokalemia: Secondary | ICD-10-CM

## 2013-03-14 DIAGNOSIS — M5430 Sciatica, unspecified side: Secondary | ICD-10-CM

## 2013-03-14 DIAGNOSIS — E669 Obesity, unspecified: Secondary | ICD-10-CM

## 2013-03-14 DIAGNOSIS — M5432 Sciatica, left side: Secondary | ICD-10-CM

## 2013-03-14 LAB — LIPID PANEL
Cholesterol: 180 mg/dL (ref 0–200)
HDL: 44.7 mg/dL (ref >39.00)
LDL Cholesterol: 109 mg/dL — ABNORMAL HIGH (ref 0–99)
Total CHOL/HDL Ratio: 4
Triglycerides: 131 mg/dL (ref 0.0–149.0)

## 2013-03-14 LAB — COMPREHENSIVE METABOLIC PANEL WITH GFR
ALT: 21 U/L (ref 0–35)
AST: 21 U/L (ref 0–37)
Albumin: 4 g/dL (ref 3.5–5.2)
Alkaline Phosphatase: 55 U/L (ref 39–117)
BUN: 9 mg/dL (ref 6–23)
CO2: 29 meq/L (ref 19–32)
Calcium: 9 mg/dL (ref 8.4–10.5)
Chloride: 98 meq/L (ref 96–112)
Creatinine, Ser: 0.6 mg/dL (ref 0.4–1.2)
GFR: 149.46 mL/min
Glucose, Bld: 110 mg/dL — ABNORMAL HIGH (ref 70–99)
Potassium: 3.2 meq/L — ABNORMAL LOW (ref 3.5–5.1)
Sodium: 135 meq/L (ref 135–145)
Total Bilirubin: 0.5 mg/dL (ref 0.3–1.2)
Total Protein: 7.9 g/dL (ref 6.0–8.3)

## 2013-03-14 MED ORDER — NABUMETONE 750 MG PO TABS: 750.0000 mg | ORAL_TABLET | Freq: Two times a day (BID) | ORAL | Status: AC

## 2013-03-14 MED ORDER — LISINOPRIL 20 MG PO TABS: 20.0000 mg | ORAL_TABLET | Freq: Every day | ORAL | Status: DC

## 2013-03-14 MED ORDER — HYDROCODONE-ACETAMINOPHEN 5-325 MG PO TABS: 1.0000 | ORAL_TABLET | Freq: Four times a day (QID) | ORAL | Status: DC | PRN

## 2013-03-14 NOTE — Patient Instructions (Signed)
I am ordering an MRI of your lumbar spine  To rule out a pinched nerve.

## 2013-03-14 NOTE — Assessment & Plan Note (Signed)
Persistent symptoms for over one year.  MRI ordered.

## 2013-03-14 NOTE — Progress Notes (Signed)
Patient ID: Kelly [name], female   DOB: [birth year], 47 y.o.   MRN: [id number]  Patient Active Problem List  Diagnosis  . Obesity (BMI 30-39.9)  . Sciatica of left side  . Depression (emotion)  . Migraine headache without aura  . Allergic conjunctivitis of both eyes  . Menopause ovarian failure  . Tremor of unknown origin  . Orthostasis  . Other and unspecified hyperlipidemia    Subjective:  CC:   Chief Complaint  Patient presents with  . Follow-up    HPI:   Kelly [name] a 47 y.o. female who presents Follow up on hypertension and obesity ,  She has lost 13 lbs  Over the last 3 months throught low carbohydrate diet. She has been having episodes of feeling off  Frequently ,  Occurring about 3  days per week most noticeably when standing up .,   He has been having headaches , frontal and maxillary locations without purulent sinus drainage. Her ears have been itching a lot .  nO discharge.   persistent left sided sciatic pain radiating to knee that has had no improvement despite PT . She has noted left sided weakness and has had occasional stumbles.  Needs MRI  followed by a referral to Dr. Shirlyn Goltz    Past Medical History  Diagnosis Date  . Anemia     severe due to menorrhagia, s/p hysterectomy  . Chest pain 2002    abnormal EKG, negative treadmill stress test/Holter myoview, Dr. Welton Flakes  . Obesity   . Obstructive sleep apnea 2009    mild, wears CPAP  . Preeclampsia     delivered twins at 35 weeks    Past Surgical History  Procedure Laterality Date  . Abdominal hysterectomy  35    secondary to post partum hemorrhage causing anemia    The following portions of the patient's history were reviewed and updated as appropriate: Allergies, current medications, and problem list.    Review of Systems:  Patient denies headache, fevers, malaise, unintentional weight loss, skin rash, eye pain, sinus congestion and sinus pain, sore throat, dysphagia,  hemoptysis ,  cough, dyspnea, wheezing, chest pain, palpitations, orthopnea, edema, abdominal pain, nausea, melena, diarrhea, constipation, flank pain, dysuria, hematuria, urinary  Frequency, nocturia, numbness, tingling, seizures,  Focal weakness, Loss of consciousness,  Tremor, insomnia, depression, anxiety, and suicidal ideation.      History   Social History  . Marital Status: Married    Spouse Name: N/A    Number of Children: N/A  . Years of Education: N/A   Occupational History  . Part-time     Midwife   Social History Main Topics  . Smoking status: Never Smoker   . Smokeless tobacco: Never Used  . Alcohol Use: Yes     Comment: occasional  . Drug Use: No  . Sexually Active: Not on file   Other Topics Concern  . Not on file   Social History Narrative  . No narrative on file    Objective:  BP 106/78  Pulse 89  Temp(Src) 98 F (36.7 C) (Oral)  Resp 16  Wt 240 lb (108.863 kg)  BMI 37.58 kg/m2  SpO2 97%  General appearance: alert, cooperative and appears stated age Ears: normal TM's and external ear canals both ears Throat: lips, mucosa, and tongue normal; teeth and gums normal Neck: no adenopathy, no carotid bruit, supple, symmetrical, trachea midline and thyroid not enlarged, symmetric, no tenderness/mass/nodules Back: symmetric, no curvature. ROM normal. No CVA tenderness. Lungs: clear  to auscultation bilaterally Heart: regular rate and rhythm, S1, S2 normal, no murmur, click, rub or gallop Abdomen: soft, non-tender; bowel sounds normal; no masses,  no organomegaly Pulses: 2+ and symmetric Skin: Skin color, texture, turgor normal. No rashes or lesions Lymph nodes: Cervical, supraclavicular, and axillary nodes normal. MSK:  Positive straight leg lift left leg  Assessment and Plan:  Obesity (BMI 30-39.9) Improving with weight loss of 13 lbs.  Encouragement given.   Sciatica of left side Persistent symptoms for over one year.  MRI ordered.   Orthostasis Stopping  hctz   Other and unspecified hyperlipidemia improvement in triglycerides by 50 pts with with weight loss .    Updated Medication List Outpatient Encounter Prescriptions as of 03/14/2013  Medication Sig Dispense Refill  . escitalopram (LEXAPRO) 10 MG tablet Take 1 tablet (10 mg total) by mouth daily.  30 tablet  3  . fluticasone (FLONASE) 50 MCG/ACT nasal spray Place 2 sprays into the nose daily.  16 g  6  . lansoprazole (PREVACID) 30 MG capsule Take 30 mg by mouth as needed.      . traMADol (ULTRAM) 50 MG tablet Take 1 tablet by mouth daily.      . [DISCONTINUED] lisinopril-hydrochlorothiazide (PRINZIDE,ZESTORETIC) 20-25 MG per tablet TAKE 1 TABLET BY MOUTH EVERY DAY FOR BLOOD PRESSURE  90 tablet  0  . HYDROcodone-acetaminophen (NORCO/VICODIN) 5-325 MG per tablet Take 1 tablet by mouth every 6 (six) hours as needed for pain.  30 tablet  3  . lisinopril (PRINIVIL,ZESTRIL) 20 MG tablet Take 1 tablet (20 mg total) by mouth daily.  90 tablet  3  . methocarbamol (ROBAXIN) 750 MG tablet       . nabumetone (RELAFEN) 750 MG tablet Take 1 tablet (750 mg total) by mouth 2 (two) times daily.  60 tablet  1  . [DISCONTINUED] fluconazole (DIFLUCAN) 150 MG tablet Take 1 tablet (150 mg total) by mouth daily.  2 tablet  0  . [DISCONTINUED] nabumetone (RELAFEN) 750 MG tablet Take 1 tablet (750 mg total) by mouth 2 (two) times daily.  60 tablet  1   No facility-administered encounter medications on file as of 03/14/2013.     Orders Placed This Encounter  Procedures  . MR Lumbar Spine Wo Contrast  . Lipid panel  . Comprehensive metabolic panel    No Follow-up on file.

## 2013-03-14 NOTE — Assessment & Plan Note (Signed)
improvement in triglycerides by 50 pts with with weight loss .

## 2013-03-14 NOTE — Assessment & Plan Note (Signed)
Stopping hctz

## 2013-03-14 NOTE — Assessment & Plan Note (Signed)
Improving with weight loss of 13 lbs.  Encouragement given.

## 2013-03-15 ENCOUNTER — Encounter: Payer: Self-pay | Admitting: Internal Medicine

## 2013-03-15 ENCOUNTER — Other Ambulatory Visit: Payer: Self-pay | Admitting: Internal Medicine

## 2013-03-15 MED ORDER — POTASSIUM CHLORIDE CRYS ER 10 MEQ PO TBCR: 20.0000 meq | EXTENDED_RELEASE_TABLET | Freq: Every day | ORAL | Status: DC

## 2013-03-15 NOTE — Addendum Note (Signed)
Addended by: [name] on: 03/15/2013 10:27 AM   Modules accepted: Orders

## 2013-03-15 NOTE — Telephone Encounter (Signed)
Med filled.  

## 2013-03-26 ENCOUNTER — Ambulatory Visit: Payer: Self-pay | Admitting: Internal Medicine

## 2013-03-30 ENCOUNTER — Telehealth: Payer: Self-pay | Admitting: Internal Medicine

## 2013-03-30 DIAGNOSIS — M5432 Sciatica, left side: Secondary | ICD-10-CM

## 2013-03-30 NOTE — Telephone Encounter (Signed)
Left message on voicemail for patient to call office. 

## 2013-03-30 NOTE — Telephone Encounter (Signed)
Her MRI of the lumbar spine suggest that her left foot weakness is coming from a herniated disc at L5 that is pushing on both nerve roots at the S1 level. I'm recommending a neuro surgical evaluation. I would like to refer her to the neurosurgeons in [place] unless she has a different group in mind.

## 2013-03-30 NOTE — Assessment & Plan Note (Signed)
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 Referral to neurosurgery advised. MRI was done at [facility].

## 2013-04-05 NOTE — Telephone Encounter (Signed)
Left message for patient to call office on home phone and mobile second attempt to contact patient.

## 2013-04-05 NOTE — Telephone Encounter (Signed)
Referral is in process as requested 

## 2013-04-06 NOTE — Telephone Encounter (Signed)
Patient notified of referral in process.

## 2013-04-20 ENCOUNTER — Emergency Department: Payer: Self-pay | Admitting: Emergency Medicine

## 2013-04-28 ENCOUNTER — Encounter: Payer: Self-pay | Admitting: Internal Medicine

## 2013-10-04 NOTE — Progress Notes (Signed)
This encounter was created in error - please disregard.

## 2013-10-27 ENCOUNTER — Other Ambulatory Visit: Payer: Self-pay

## 2013-11-11 ENCOUNTER — Ambulatory Visit (INDEPENDENT_AMBULATORY_CARE_PROVIDER_SITE_OTHER): Payer: BC Managed Care – PPO | Admitting: Internal Medicine

## 2013-11-11 ENCOUNTER — Other Ambulatory Visit: Payer: Self-pay | Admitting: Internal Medicine

## 2013-11-11 ENCOUNTER — Encounter: Payer: Self-pay | Admitting: Internal Medicine

## 2013-11-11 VITALS — BP 138/88 | HR 108 | Temp 98.2°F | Resp 12 | Wt 233.5 lb

## 2013-11-11 DIAGNOSIS — M25511 Pain in right shoulder: Secondary | ICD-10-CM | POA: Insufficient documentation

## 2013-11-11 DIAGNOSIS — K589 Irritable bowel syndrome without diarrhea: Secondary | ICD-10-CM | POA: Insufficient documentation

## 2013-11-11 DIAGNOSIS — N390 Urinary tract infection, site not specified: Secondary | ICD-10-CM

## 2013-11-11 DIAGNOSIS — K5900 Constipation, unspecified: Secondary | ICD-10-CM

## 2013-11-11 DIAGNOSIS — K625 Hemorrhage of anus and rectum: Secondary | ICD-10-CM

## 2013-11-11 DIAGNOSIS — M25512 Pain in left shoulder: Secondary | ICD-10-CM

## 2013-11-11 DIAGNOSIS — M25519 Pain in unspecified shoulder: Secondary | ICD-10-CM

## 2013-11-11 DIAGNOSIS — R300 Dysuria: Secondary | ICD-10-CM

## 2013-11-11 LAB — POCT URINALYSIS DIPSTICK
Bilirubin, UA: NEGATIVE
Ketones, UA: NEGATIVE
pH, UA: 7

## 2013-11-11 MED ORDER — TRAMADOL HCL 50 MG PO TABS: 50.0000 mg | ORAL_TABLET | Freq: Every day | ORAL | Status: DC

## 2013-11-11 MED ORDER — TRAMADOL HCL 50 MG PO TABS: 50.0000 mg | ORAL_TABLET | Freq: Three times a day (TID) | ORAL | Status: DC | PRN

## 2013-11-11 MED ORDER — TIZANIDINE HCL 4 MG PO TABS: 4.0000 mg | ORAL_TABLET | Freq: Four times a day (QID) | ORAL | Status: DC | PRN

## 2013-11-11 MED ORDER — HYDROCORTISONE 2.5 % RE CREA: 1.0000 "application " | TOPICAL_CREAM | Freq: Two times a day (BID) | RECTAL | Status: DC

## 2013-11-11 NOTE — Telephone Encounter (Signed)
#  30 was sent to the pharmacy today. Note states pt asking for 90 day supply?

## 2013-11-11 NOTE — Patient Instructions (Addendum)
Your urine was normal but we will send it for culture  Try the Amitiza and linzess for constipation .  Amitiza is used up to twice daily  Linzess 1 to 2 capsules daily (max dose is 2)   anusol hc as needed for hemorrhoids  Tramadol and muscrl relaxer (tizanidine) for shoulder pain .  Do circular motion and pendulum swing exercises  To maintain mobility  If no improvement in  2 weeks  Call for x rays/PT eval

## 2013-11-11 NOTE — Progress Notes (Signed)
Patient ID: Kelly [name], female   DOB: 1966-04-18, 47 y.o.   MRN: [id number]  Patient Active Problem List   Diagnosis Date Noted  . Dysuria 11/13/2013  . Unspecified constipation 11/11/2013  . Pain in joint, shoulder region 11/11/2013  . Bright red blood per rectum 11/11/2013  . Orthostasis 03/14/2013  . Other and unspecified hyperlipidemia 03/14/2013  . Menopause ovarian failure 09/13/2012  . Tremor of unknown origin 09/13/2012  . Depression (emotion) 07/27/2012  . Migraine headache without aura 07/27/2012  . Allergic conjunctivitis of both eyes 07/27/2012  . Sciatica of left side 01/08/2012  . Obesity (BMI 30-39.9) 12/31/2011    Subjective:  CC:   Chief Complaint  Patient presents with  . Urinary Tract Infection    dysuria, frequency x 1 week    HPI:   Kelly [name] a 47 y.o. female who presents for an acute visit to evaluate recent onset of burning with urination and suprapubic pressure for past week,  No blood with urine, no fevers ,  Some nausea. UA was normal so additional evaluatio nwas needed.   She reports Chronic constipation  With some BRBR .  Moves bowels every other day .  She had lumbar surgery in July  80% of pain relieved, still needs occasional tramadol.  Has not taken any tramadol in weeks due to prescription expiring.    Left Shoulder pain going on for a couple of weeks.  Aggravated by motion.  external rotation painful and limited.  No history of trauma. .    Past Medical History  Diagnosis Date  . Anemia     severe due to menorrhagia, s/p hysterectomy  . Chest pain 2002    abnormal EKG, negative treadmill stress test/Holter myoview, Dr. Welton Flakes  . Obesity   . Obstructive sleep apnea 2009    mild, wears CPAP  . Preeclampsia     delivered twins at 35 weeks    Past Surgical History  Procedure Laterality Date  . Abdominal hysterectomy  35    secondary to post partum hemorrhage causing anemia  . Spine surgery      lumbar spine        The following portions of the patient's history were reviewed and updated as appropriate: Allergies, current medications, and problem list.    Review of Systems:   12 Pt  review of systems was negative except those addressed in the HPI,     History   Social History  . Marital Status: Married    Spouse Name: N/A    Number of Children: N/A  . Years of Education: N/A   Occupational History  . Part-time     Midwife   Social History Main Topics  . Smoking status: Never Smoker   . Smokeless tobacco: Never Used  . Alcohol Use: Yes     Comment: occasional  . Drug Use: No  . Sexual Activity: Not Currently   Other Topics Concern  . Not on file   Social History Narrative  . No narrative on file    Objective:  Filed Vitals:   11/11/13 1413  BP: 138/88  Pulse: 108  Temp: 98.2 F (36.8 C)  Resp: 12     General appearance: alert, cooperative and appears stated age Ears: normal TM's and external ear canals both ears Throat: lips, mucosa, and tongue normal; teeth and gums normal Neck: no adenopathy, no carotid bruit, supple, symmetrical, trachea midline and thyroid not enlarged, symmetric, no tenderness/mass/nodules Back: symmetric, no curvature. ROM normal.  No CVA tenderness. Lungs: clear to auscultation bilaterally Heart: regular rate and rhythm, S1, S2 normal, no murmur, click, rub or gallop Abdomen: soft, non-tender; bowel sounds normal; no masses,  no organomegaly Pulses: 2+ and symmetric Skin: Skin color, texture, turgor normal. No rashes or lesions Lymph nodes: Cervical, supraclavicular, and axillary nodes normal. MSK:  Left shoulder decreased external ROM, scapular pain .   Assessment and Plan:  Dysuria UA was negative for nitrates,LE but positive for blood and culture grew 75K E coli.  Will treat empirically with septra DS   Unspecified constipation miralax daily.  Stool softener daily.  Sample of amitiza and linzess given.  Bright red  blood per rectum Secondary to hemorrhoidal bleeding suspected based on today's rectal exam notablre ofr internal hermorrhoids   Pain in joint, shoulder region Tramadol and muscle  relaxer prescribed for  shoulder pain with external rotation .   A total of 40 minutes was spent with patient more than half of which was spent in counseling, reviewing records from other prviders and coordination of care.  Updated Medication List Outpatient Encounter Prescriptions as of 11/11/2013  Medication Sig  . lisinopril (PRINIVIL,ZESTRIL) 20 MG tablet Take 1 tablet (20 mg total) by mouth daily.  Marland Kitchen escitalopram (LEXAPRO) 10 MG tablet Take 1 tablet (10 mg total) by mouth daily.  . fluticasone (FLONASE) 50 MCG/ACT nasal spray Place 2 sprays into the nose daily.  . hydrocortisone (ANUSOL-HC) 2.5 % rectal cream Place 1 application rectally 2 (two) times daily.  . lansoprazole (PREVACID) 30 MG capsule Take 30 mg by mouth as needed.  . methocarbamol (ROBAXIN) 750 MG tablet   . nabumetone (RELAFEN) 750 MG tablet Take 1 tablet (750 mg total) by mouth 2 (two) times daily.  . potassium chloride (K-DUR,KLOR-CON) 10 MEQ tablet TAKE 2 TABLETS BY MOUTH EVERY DAY  . sulfamethoxazole-trimethoprim (BACTRIM DS) 800-160 MG per tablet Take 1 tablet by mouth 2 (two) times daily.  . traMADol (ULTRAM) 50 MG tablet Take 1 tablet (50 mg total) by mouth every 8 (eight) hours as needed.  . [DISCONTINUED] HYDROcodone-acetaminophen (NORCO/VICODIN) 5-325 MG per tablet Take 1 tablet by mouth every 6 (six) hours as needed for pain.  . [DISCONTINUED] tiZANidine (ZANAFLEX) 4 MG tablet Take 1 tablet (4 mg total) by mouth every 6 (six) hours as needed for muscle spasms.  . [DISCONTINUED] traMADol (ULTRAM) 50 MG tablet Take 1 tablet by mouth daily.  . [DISCONTINUED] traMADol (ULTRAM) 50 MG tablet Take 1 tablet (50 mg total) by mouth every 8 (eight) hours as needed.  . [DISCONTINUED] traMADol (ULTRAM) 50 MG tablet Take 1 tablet (50 mg total) by  mouth daily.     Orders Placed This Encounter  Procedures  . CULTURE, URINE COMPREHENSIVE  . POCT urinalysis dipstick    No Follow-up on file.

## 2013-11-13 ENCOUNTER — Encounter: Payer: Self-pay | Admitting: Internal Medicine

## 2013-11-13 DIAGNOSIS — R300 Dysuria: Secondary | ICD-10-CM | POA: Insufficient documentation

## 2013-11-13 MED ORDER — SULFAMETHOXAZOLE-TMP DS 800-160 MG PO TABS: 1.0000 | ORAL_TABLET | Freq: Two times a day (BID) | ORAL | Status: DC

## 2013-11-13 NOTE — Assessment & Plan Note (Signed)
Secondary to hemorrhoidal bleeding suspected based on today's rectal exam notablre ofr internal hermorrhoids

## 2013-11-13 NOTE — Assessment & Plan Note (Addendum)
miralax daily.  Stool softener daily.  Sample of amitiza and linzess given.

## 2013-11-13 NOTE — Assessment & Plan Note (Signed)
Tramadol and muscle  relaxer prescribed for  shoulder pain with external rotation .

## 2013-11-13 NOTE — Assessment & Plan Note (Signed)
UA was negative for nitrates,LE but positive for blood and culture grew 75K E coli.  Will treat empirically with septra DS

## 2013-11-14 LAB — CULTURE, URINE COMPREHENSIVE: Colony Count: 75000

## 2014-03-24 ENCOUNTER — Other Ambulatory Visit: Payer: Self-pay | Admitting: Internal Medicine

## 2014-06-25 ENCOUNTER — Other Ambulatory Visit: Payer: Self-pay | Admitting: Internal Medicine

## 2014-06-25 DIAGNOSIS — Z79899 Other long term (current) drug therapy: Secondary | ICD-10-CM

## 2014-06-26 ENCOUNTER — Other Ambulatory Visit: Payer: Self-pay | Admitting: Internal Medicine

## 2014-06-26 ENCOUNTER — Other Ambulatory Visit: Payer: Self-pay | Admitting: *Deleted

## 2014-06-26 MED ORDER — LISINOPRIL 20 MG PO TABS: ORAL_TABLET | ORAL | Status: DC

## 2014-06-26 NOTE — Telephone Encounter (Signed)
Last refill 4.6.15, last OV 11.21.14, no future OV.  Please advise refill.

## 2014-06-26 NOTE — Telephone Encounter (Signed)
Refill one 30 days only.  Has not been seen in over 6 months so needs labs/office visit prior to any more refills

## 2014-09-18 ENCOUNTER — Encounter: Payer: Self-pay | Admitting: Internal Medicine

## 2014-09-18 ENCOUNTER — Ambulatory Visit (INDEPENDENT_AMBULATORY_CARE_PROVIDER_SITE_OTHER): Payer: BC Managed Care – PPO | Admitting: Internal Medicine

## 2014-09-18 ENCOUNTER — Other Ambulatory Visit: Payer: Self-pay | Admitting: Internal Medicine

## 2014-09-18 VITALS — BP 148/88 | HR 93 | Temp 99.2°F | Resp 16 | Ht 66.0 in | Wt 244.2 lb

## 2014-09-18 DIAGNOSIS — K589 Irritable bowel syndrome without diarrhea: Secondary | ICD-10-CM

## 2014-09-18 DIAGNOSIS — M545 Low back pain, unspecified: Secondary | ICD-10-CM

## 2014-09-18 DIAGNOSIS — Z1159 Encounter for screening for other viral diseases: Secondary | ICD-10-CM

## 2014-09-18 DIAGNOSIS — R0989 Other specified symptoms and signs involving the circulatory and respiratory systems: Secondary | ICD-10-CM

## 2014-09-18 DIAGNOSIS — F411 Generalized anxiety disorder: Secondary | ICD-10-CM | POA: Insufficient documentation

## 2014-09-18 DIAGNOSIS — Z23 Encounter for immunization: Secondary | ICD-10-CM

## 2014-09-18 DIAGNOSIS — I10 Essential (primary) hypertension: Secondary | ICD-10-CM

## 2014-09-18 DIAGNOSIS — Z90710 Acquired absence of both cervix and uterus: Secondary | ICD-10-CM

## 2014-09-18 DIAGNOSIS — E669 Obesity, unspecified: Secondary | ICD-10-CM

## 2014-09-18 DIAGNOSIS — R0609 Other forms of dyspnea: Secondary | ICD-10-CM

## 2014-09-18 DIAGNOSIS — K219 Gastro-esophageal reflux disease without esophagitis: Secondary | ICD-10-CM

## 2014-09-18 DIAGNOSIS — R069 Unspecified abnormalities of breathing: Secondary | ICD-10-CM | POA: Insufficient documentation

## 2014-09-18 DIAGNOSIS — R0683 Snoring: Secondary | ICD-10-CM | POA: Insufficient documentation

## 2014-09-18 DIAGNOSIS — F32A Depression, unspecified: Secondary | ICD-10-CM

## 2014-09-18 DIAGNOSIS — R5383 Other fatigue: Secondary | ICD-10-CM

## 2014-09-18 DIAGNOSIS — F329 Major depressive disorder, single episode, unspecified: Secondary | ICD-10-CM

## 2014-09-18 DIAGNOSIS — Z0000 Encounter for general adult medical examination without abnormal findings: Secondary | ICD-10-CM

## 2014-09-18 DIAGNOSIS — Z8719 Personal history of other diseases of the digestive system: Secondary | ICD-10-CM

## 2014-09-18 DIAGNOSIS — R5381 Other malaise: Secondary | ICD-10-CM

## 2014-09-18 DIAGNOSIS — F3289 Other specified depressive episodes: Secondary | ICD-10-CM

## 2014-09-18 DIAGNOSIS — Z90721 Acquired absence of ovaries, unilateral: Secondary | ICD-10-CM

## 2014-09-18 DIAGNOSIS — Z1239 Encounter for other screening for malignant neoplasm of breast: Secondary | ICD-10-CM

## 2014-09-18 MED ORDER — LANSOPRAZOLE 30 MG PO CPDR: 30.0000 mg | DELAYED_RELEASE_CAPSULE | ORAL | Status: DC | PRN

## 2014-09-18 MED ORDER — TRAMADOL HCL 50 MG PO TABS: 50.0000 mg | ORAL_TABLET | Freq: Three times a day (TID) | ORAL | Status: DC | PRN

## 2014-09-18 MED ORDER — LUBIPROSTONE 8 MCG PO CAPS: 8.0000 ug | ORAL_CAPSULE | Freq: Two times a day (BID) | ORAL | Status: DC

## 2014-09-18 MED ORDER — LISINOPRIL 20 MG PO TABS: ORAL_TABLET | ORAL | Status: DC

## 2014-09-18 MED ORDER — SERTRALINE HCL 50 MG PO TABS: 50.0000 mg | ORAL_TABLET | Freq: Every day | ORAL | Status: DC

## 2014-09-18 MED ORDER — TIZANIDINE HCL 4 MG PO TABS: 4.0000 mg | ORAL_TABLET | Freq: Three times a day (TID) | ORAL | Status: DC | PRN

## 2014-09-18 NOTE — Telephone Encounter (Signed)
Ok to refill,  Refill sent  

## 2014-09-18 NOTE — Progress Notes (Signed)
Pre-visit discussion using our clinic review tool. No additional management support is needed unless otherwise documented below in the visit note.  

## 2014-09-18 NOTE — Assessment & Plan Note (Addendum)
Discussed resuming zoloft ,  Return in one month

## 2014-09-18 NOTE — Telephone Encounter (Signed)
Ok to send as 90 day supply 

## 2014-09-18 NOTE — Patient Instructions (Addendum)
You had your annual  wellness exam today.    We will schedule your mammogram   You received the TDaP vaccine today and the flu vaccine today. .  If you develop a sore arm , use tylenol and ibuprofen for a few days   Please make an appt for fasting labs at your earliest convenience.   We will contact you with the bloodwork results  Please return in one month for follow up on depression/anxiey,  insomnia  And hypertension   This is  my version of a  "Low GI"  Diet:  It will still lower your blood sugars and allow you to lose 4 to 8  lbs  per month if you follow it carefully.  Your goal with exercise is a minimum of 30 minutes of aerobic exercise 5 days per week (Walking does not count once it becomes easy!)      All of the foods can be found at grocery stores and in bulk at [employer].  The Atkins protein bars and shakes are available in more varieties at Target, WalMart and [employer]'s Foods.     7 AM Breakfast:  Choose from the following:  Low carbohydrate Protein  Shakes (I recommend the [employer]AS AdvantEdge "Carb Control" shakes  Or the low carb shakes by Atkins.    2.5 carbs   Arnold's "Sandwhich Thin"toasted  w/ peanut butter (no jelly: about 20 net carbs  "Bagel Thin" with cream cheese and salmon: about 20 carbs   a scrambled egg/bacon/cheese burrito made with Mission's "carb balance" whole wheat tortilla  (about 10 net carbs )  A slice of home made fritatta (egg based dish without a crust:  google it)    Avoid cereal and bananas, oatmeal and cream of wheat and grits. They are loaded with carbohydrates!   10 AM: high protein snack  Protein bar by Atkins (the snack size, under 200 cal, usually < 6 net carbs).    A stick of cheese:  Around 1 carb,  100 cal     Dannon Light n Fit [place] Yogurt  (80 cal, 8 carbs)  Other so called "protein bars" and Greek yogurts tend to be loaded with carbohydrates.  Remember, in food advertising, the word "energy" is synonymous for " carbohydrate."  Lunch:    A Sandwich using the bread choices listed, Can use any  Eggs,  lunchmeat, grilled meat or canned tuna), avocado, regular mayo/mustard  and cheese.  A Salad using blue cheese, ranch,  Goddess or vinagrette,  No croutons or "confetti" and no "candied nuts" but regular nuts OK.   No pretzels or chips.  Pickles and miniature sweet peppers are a good low carb alternative that provide a "crunch"  The bread is the only source of carbohydrate in a sandwich and  can be decreased by trying some of these alternatives to traditional loaf bread  Joseph's makes a pita bread and a flat bread that are 50 cal and 4 net carbs available at BJs and WalMart.  This can be toasted to use with hummous as well  Toufayan makes a low carb flatbread that's 100 cal and 9 net carbs available at [employer] and [employer] makes 2 sizes of  Low carb whole wheat tortilla  (The large one is 210 cal and 6 net carbs) Avoid "Low fat dressings, as well as Reyne Dumas and [address] dressings They are loaded with sugar!   3 PM/ Mid day  Snack:  Consider  1  ounce of  almonds, walnuts, pistachios, pecans, peanuts,  Macadamia nuts or a nut medley.  Avoid "granola"; the dried cranberries and raisins are loaded with carbohydrates. Mixed nuts as long as there are no raisins,  cranberries or dried fruit.    Try the prosciutto/mozzarella cheese sticks by [employer]  In deli /backery section   High protein   To avoid overindulging in snacks: Try drinking a glass of unsweeted almond/coconut milk  Or a cup of coffee with your Atkins chocolate bar t o keep you from having 3!!!        6 PM  Dinner:     Meat/fowl/fish with a green salad, and either broccoli, cauliflower, green beans, spinach, brussel sprouts or  Lima beans. DO NOT BREAD THE PROTEIN!!      There is a low carb pasta by Dreamfield's that is acceptable and tastes great: only 5 digestible carbs/serving.( All grocery stores but BJs carry it )  Try Kai Levins Angelo's chicken piccata or  chicken or eggplant parm over low carb pasta.(Lowes and BJs)   Clifton Custard Sanchez's "Carnitas" (pulled pork, no sauce,  0 carbs) or his beef pot roast to make a dinner burrito (at BJ's)  Pesto over low carb pasta (bj's sells a good quality pesto in the center refrigerated section of the deli   Try satueeing  Roosvelt Harps with mushroooms  Whole wheat pasta is still full of digestible carbs and  Not as low in glycemic index as Dreamfield's.   Brown rice is still rice,  So skip the rice and noodles if you eat [place] or [place] (or at least limit to 1/2 cup)  9 PM snack :   Breyer's "low carb" fudgsicle or  ice cream bar (Carb Smart line), or  Weight Watcher's ice cream bar , or another "no sugar added" ice cream;  a serving of fresh berries/cherries with whipped cream   Cheese or DANNON'S LlGHT N FIT GREEK YOGURT or the Oikos greek yogurt   8 ounces of [place] unsweetened almond/cococunut milk    Avoid bananas, pineapple, grapes  and watermelon on a regular basis because they are high in sugar.  THINK OF THEM AS DESSERT  Remember that snack Substitutions should be less than 10 NET carbs per serving and meals < 20 carbs. Remember to subtract fiber grams to get the "net carbs."

## 2014-09-18 NOTE — Progress Notes (Signed)
Patient ID: Kelly [name], female   DOB: 1966-01-07, 48 y.o.   MRN: [id number]  Subjective:     Kelly [name] is a 48 y.o. female and is here for a comprehensive physical exam. The patient reports problems - as discussed below:.   1) She has been experiencing recurrent moodiness, interrupted sleep/insomnia with resulting daytime daytime somnolence,  Including sleepiness while driving ,  For the past several months.    Emotional stressors include recent marital discord, now resolving,  Remodeling of her home  And occupational stressors. She is waking up in the middle  of the night several times and wakes up tired in the morning.  Denies  no nocturia .  History of snoring with prior sleep study positive for mild OSA but CPAP ot employed Has gained considerable wt since last sleep study was done. . 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   2) Chronic constipation  Relieved wigh IBS dose of amitiza    History   Social History  . Marital Status: Married    Spouse Name: N/A    Number of Children: N/A  . Years of Education: N/A   Occupational History  . Part-time     Midwife   Social History Main Topics  . Smoking status: Never Smoker   . Smokeless tobacco: Never Used  . Alcohol Use: Yes     Comment: occasional  . Drug Use: No  . Sexual Activity: Not Currently   Other Topics Concern  . Not on file   Social History Narrative  . No narrative on file   Health Maintenance  Topic Date Due  . Pap Smear  07/21/1984  . Influenza Vaccine  07/23/2015  . Tetanus/tdap  09/18/2024    The following portions of the patient's history were reviewed and updated as appropriate: allergies, current medications, past family history, past medical history, past social history, past surgical history and problem list.  Review of Systems A comprehensive review of systems was negative.   Objective:   BP 148/88  Pulse 93   Temp(Src) 99.2 F (37.3 C) (Oral)  Resp 16  Ht  (1.676 m)  Wt 244 lb 4 oz (110.791 kg)  BMI 39.44 kg/m2  SpO2 96% General appearance: alert, cooperative and appears stated age Head: Normocephalic, without obvious abnormality, atraumatic Eyes: conjunctivae/corneas clear. PERRL, EOM's intact. Fundi benign. Ears: normal TM's and external ear canals both ears Nose: Nares normal. Septum midline. Mucosa normal. No drainage or sinus tenderness. Throat: lips, mucosa, and tongue normal; teeth and gums normal Neck: no adenopathy, no carotid bruit, no JVD, supple, symmetrical, trachea midline and thyroid not enlarged, symmetric, no tenderness/mass/nodules Lungs: clear to auscultation bilaterally Breasts: normal appearance, no masses or tenderness Heart: regular rate and rhythm, S1, S2 normal, no murmur, click, rub or gallop Abdomen: soft, non-tender; bowel sounds normal; no masses,  no organomegaly Extremities: extremities normal, atraumatic, no cyanosis or edema Pulses: 2+ and symmetric Skin: Skin color, texture, turgor normal. No rashes or lesions Neurologic: Alert and oriented X 3, normal strength and tone. Normal symmetric reflexes. Normal coordination and gait.   .    Assessment and plan:   Generalized anxiety disorder Discussed resuming zoloft ,  Return in one month  Irritable bowel syndrome Improved with low dose amitiza trial using samples.  rx given   Snoring Discussed repeating her sleep study if daytime somnolence and insomnia hve not improved with starting SSRI  for GAD< in one month   Depression (emotion) Treated previously with lexapro for concurrent amelioration of menopause symptoms.   Obesity (BMI 30-39.9) I have addressed  BMI and recommended wt loss of 10% of body weigh over the next 6 months using a low glycemic index diet and regular exercise a minimum of 5 days per week.    Visit for preventive health examination Annual comprehensive exam was done including  breast, excluding pelvic and PAP smear. All screenings have been addressed and a printed health maintenance schedule was given to patient.     Updated Medication List Outpatient Encounter Prescriptions as of 09/18/2014  Medication Sig  . fluticasone (FLONASE) 50 MCG/ACT nasal spray Place 2 sprays into the nose daily.  . lansoprazole (PREVACID) 30 MG capsule Take 1 capsule (30 mg total) by mouth as needed.  Marland Kitchen lisinopril (PRINIVIL,ZESTRIL) 20 MG tablet TAKE 1 TABLET BY MOUTH EVERY DAY  . traMADol (ULTRAM) 50 MG tablet Take 1 tablet (50 mg total) by mouth every 8 (eight) hours as needed.  . [DISCONTINUED] lansoprazole (PREVACID) 30 MG capsule Take 30 mg by mouth as needed.  . [DISCONTINUED] lisinopril (PRINIVIL,ZESTRIL) 20 MG tablet TAKE 1 TABLET BY MOUTH EVERY DAY  . [DISCONTINUED] methocarbamol (ROBAXIN) 750 MG tablet   . [DISCONTINUED] tiZANidine (ZANAFLEX) 4 MG tablet Take 1 tablet (4 mg total) by mouth every 8 (eight) hours as needed for muscle spasms.  . [DISCONTINUED] tiZANidine (ZANAFLEX) 4 MG tablet Take 1 tablet (4 mg total) by mouth every 8 (eight) hours as needed for muscle spasms.  . [DISCONTINUED] traMADol (ULTRAM) 50 MG tablet Take 1 tablet (50 mg total) by mouth every 8 (eight) hours as needed.  . hydrocortisone (ANUSOL-HC) 2.5 % rectal cream Place 1 application rectally 2 (two) times daily.  Marland Kitchen lubiprostone (AMITIZA) 8 MCG capsule Take 1 capsule (8 mcg total) by mouth 2 (two) times daily with a meal.  . sertraline (ZOLOFT) 50 MG tablet Take 1 tablet (50 mg total) by mouth daily.  . [DISCONTINUED] escitalopram (LEXAPRO) 10 MG tablet Take 1 tablet (10 mg total) by mouth daily.  . [DISCONTINUED] potassium chloride (K-DUR,KLOR-CON) 10 MEQ tablet TAKE 2 TABLETS BY MOUTH EVERY DAY  . [DISCONTINUED] sulfamethoxazole-trimethoprim (BACTRIM DS) 800-160 MG per tablet Take 1 tablet by mouth 2 (two) times daily.

## 2014-09-19 DIAGNOSIS — Z90721 Acquired absence of ovaries, unilateral: Secondary | ICD-10-CM

## 2014-09-19 DIAGNOSIS — Z0001 Encounter for general adult medical examination with abnormal findings: Secondary | ICD-10-CM | POA: Insufficient documentation

## 2014-09-19 DIAGNOSIS — Z0000 Encounter for general adult medical examination without abnormal findings: Secondary | ICD-10-CM | POA: Insufficient documentation

## 2014-09-19 DIAGNOSIS — Z90710 Acquired absence of both cervix and uterus: Secondary | ICD-10-CM | POA: Insufficient documentation

## 2014-09-19 NOTE — Assessment & Plan Note (Signed)
Annual comprehensive exam was done including breast, excluding pelvic and PAP smear. All screenings have been addressed and a printed health maintenance schedule was given to patient.   

## 2014-09-19 NOTE — Assessment & Plan Note (Addendum)
Treated previously with lexapro for concurrent amelioration of menopause symptoms.

## 2014-09-19 NOTE — Assessment & Plan Note (Signed)
I have addressed  BMI and recommended wt loss of 10% of body weigh over the next 6 months using a low glycemic index diet and regular exercise a minimum of 5 days per week.   

## 2014-09-19 NOTE — Assessment & Plan Note (Signed)
Improved with low dose amitiza trial using samples.  rx given

## 2014-09-19 NOTE — Assessment & Plan Note (Signed)
Discussed repeating her sleep study if daytime somnolence and insomnia hve not improved with starting SSRI for GAD< in one month

## 2014-09-22 ENCOUNTER — Other Ambulatory Visit: Payer: Self-pay | Admitting: Internal Medicine

## 2014-09-28 ENCOUNTER — Other Ambulatory Visit (INDEPENDENT_AMBULATORY_CARE_PROVIDER_SITE_OTHER): Payer: BC Managed Care – PPO

## 2014-09-28 DIAGNOSIS — R5383 Other fatigue: Secondary | ICD-10-CM

## 2014-09-28 DIAGNOSIS — Z1159 Encounter for screening for other viral diseases: Secondary | ICD-10-CM

## 2014-09-28 DIAGNOSIS — R5381 Other malaise: Secondary | ICD-10-CM

## 2014-09-28 DIAGNOSIS — I10 Essential (primary) hypertension: Secondary | ICD-10-CM

## 2014-09-28 LAB — CBC WITH DIFFERENTIAL/PLATELET
BASOS PCT: 0.4 % (ref 0.0–3.0)
Basophils Absolute: 0 10*3/uL (ref 0.0–0.1)
EOS ABS: 0.1 10*3/uL (ref 0.0–0.7)
EOS PCT: 2.2 % (ref 0.0–5.0)
HEMATOCRIT: 40.2 % (ref 36.0–46.0)
Hemoglobin: 13.1 g/dL (ref 12.0–15.0)
LYMPHS ABS: 2.6 10*3/uL (ref 0.7–4.0)
Lymphocytes Relative: 42 % (ref 12.0–46.0)
MCHC: 32.5 g/dL (ref 30.0–36.0)
MCV: 87.5 fl (ref 78.0–100.0)
MONO ABS: 0.3 10*3/uL (ref 0.1–1.0)
Monocytes Relative: 4.7 % (ref 3.0–12.0)
NEUTROS PCT: 50.7 % (ref 43.0–77.0)
Neutro Abs: 3.2 10*3/uL (ref 1.4–7.7)
Platelets: 201 10*3/uL (ref 150.0–400.0)
RBC: 4.59 Mil/uL (ref 3.87–5.11)
RDW: 14.1 % (ref 11.5–15.5)
WBC: 6.3 10*3/uL (ref 4.0–10.5)

## 2014-09-28 LAB — COMPREHENSIVE METABOLIC PANEL
ALBUMIN: 3.4 g/dL — AB (ref 3.5–5.2)
ALK PHOS: 55 U/L (ref 39–117)
ALT: 17 U/L (ref 0–35)
AST: 18 U/L (ref 0–37)
BUN: 12 mg/dL (ref 6–23)
CO2: 27 mEq/L (ref 19–32)
Calcium: 8.9 mg/dL (ref 8.4–10.5)
Chloride: 105 mEq/L (ref 96–112)
Creatinine, Ser: 0.6 mg/dL (ref 0.4–1.2)
GFR: 145.47 mL/min (ref >60.00)
Glucose, Bld: 106 mg/dL — ABNORMAL HIGH (ref 70–99)
Potassium: 4.2 mEq/L (ref 3.5–5.1)
Sodium: 138 mEq/L (ref 135–145)
Total Bilirubin: 0.5 mg/dL (ref 0.2–1.2)
Total Protein: 7.9 g/dL (ref 6.0–8.3)

## 2014-09-28 LAB — LIPID PANEL
Cholesterol: 206 mg/dL — ABNORMAL HIGH (ref 0–200)
HDL: 43.2 mg/dL (ref >39.00)
LDL CALC: 143 mg/dL — AB (ref 0–99)
NONHDL: 162.8
Total CHOL/HDL Ratio: 5
Triglycerides: 98 mg/dL (ref 0.0–149.0)
VLDL: 19.6 mg/dL (ref 0.0–40.0)

## 2014-09-28 LAB — TSH: TSH: 0.81 u[IU]/mL (ref 0.35–4.50)

## 2014-09-29 LAB — HEPATITIS C ANTIBODY: HCV Ab: NEGATIVE

## 2014-10-01 ENCOUNTER — Encounter: Payer: Self-pay | Admitting: Internal Medicine

## 2014-10-18 ENCOUNTER — Encounter: Payer: Self-pay | Admitting: Internal Medicine

## 2014-10-18 ENCOUNTER — Ambulatory Visit (INDEPENDENT_AMBULATORY_CARE_PROVIDER_SITE_OTHER): Payer: BC Managed Care – PPO | Admitting: Internal Medicine

## 2014-10-18 VITALS — BP 138/84 | HR 110 | Temp 98.8°F | Resp 16 | Ht 66.0 in | Wt 240.5 lb

## 2014-10-18 DIAGNOSIS — F339 Major depressive disorder, recurrent, unspecified: Secondary | ICD-10-CM

## 2014-10-18 DIAGNOSIS — H81313 Aural vertigo, bilateral: Secondary | ICD-10-CM

## 2014-10-18 DIAGNOSIS — R251 Tremor, unspecified: Secondary | ICD-10-CM

## 2014-10-18 DIAGNOSIS — R29898 Other symptoms and signs involving the musculoskeletal system: Secondary | ICD-10-CM

## 2014-10-18 DIAGNOSIS — I10 Essential (primary) hypertension: Secondary | ICD-10-CM

## 2014-10-18 DIAGNOSIS — H81319 Aural vertigo, unspecified ear: Secondary | ICD-10-CM | POA: Insufficient documentation

## 2014-10-18 MED ORDER — LISINOPRIL 40 MG PO TABS: ORAL_TABLET | ORAL | Status: DC

## 2014-10-18 MED ORDER — MOMETASONE FUROATE 50 MCG/ACT NA SUSP: 2.0000 | Freq: Every day | NASAL | Status: DC

## 2014-10-18 NOTE — Patient Instructions (Addendum)
Increase the lisinopril dose to 40 mg daily for your blood pressure  (new rx given)  For the vertigo,  Let's treat inner ear congestion with steroid nasal spray and sudafed Pe 10 mg  twice daily     Your tremor may be coming from weakened shoulder muscles from cervical disk disease.  I have ordered some  Xrays of your neck to look at your alignement

## 2014-10-18 NOTE — Progress Notes (Signed)
Pre-visit discussion using our clinic review tool. No additional management support is needed unless otherwise documented below in the visit note.  

## 2014-10-18 NOTE — Progress Notes (Signed)
Patient ID: Kelly PrimasJacqueline C Grantz, female   DOB: 16-Feb-1966, 48 y.o.   MRN: [id number]  Patient Active Problem List   Diagnosis Date Noted  . Vertigo, aural 10/18/2014  . Visit for preventive health examination 09/19/2014  . S/P hysterectomy with oophorectomy 09/19/2014  . Generalized anxiety disorder 09/18/2014  . Snoring 09/18/2014  . Irritable bowel syndrome 11/11/2013  . Orthostasis 03/14/2013  . Other and unspecified hyperlipidemia 03/14/2013  . Menopause ovarian failure 09/13/2012  . Tremor of unknown origin 09/13/2012  . Major depressive disorder, recurrent episode with anxious distress 07/27/2012  . Migraine headache without aura 07/27/2012  . Allergic conjunctivitis of both eyes 07/27/2012  . Sciatica of left side 01/08/2012  . Obesity (BMI 30-39.9) 12/31/2011    Subjective:  CC:   Chief Complaint  Patient presents with  . Follow-up    new medication    HPI:   Kelly [name] is a 48 y.o. female who presents for  Follow up on recent initiation of medication for symptoms of recurrent depression and generalized anxiety  Triggered by marital discord and home disruption .  Sertraline  Was initiated a month ago, and feels an improvement in her symptoms without side effects.   Her insomnia has improved.  helping .  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  No prior episodes   .Marland Kitchen. Ears feeling full for the past week .  Recently relocated to a remodeled house,  Some fumes noted in the house that cleared out after a few days.    Past Medical History  Diagnosis Date  . Anemia     severe due to menorrhagia, s/p hysterectomy  . Chest pain 2002    abnormal EKG, negative treadmill stress test/Holter myoview, Dr. Welton FlakesKhan  . Obesity   . Obstructive sleep apnea 2009    mild, wears CPAP  . Preeclampsia     delivered twins  at 35 weeks    Past Surgical History  Procedure Laterality Date  . Abdominal hysterectomy  35    secondary to post partum hemorrhage causing anemia  . Spine surgery      lumbar spine       The following portions of the patient's history were reviewed and updated as appropriate: Allergies, current medications, and problem list.    Review of Systems:   Patient denies headache, fevers, malaise, unintentional weight loss, skin rash, eye pain, sinus congestion and sinus pain, sore throat, dysphagia,  hemoptysis , cough, dyspnea, wheezing, chest pain, palpitations, orthopnea, edema, abdominal pain, nausea, melena, diarrhea, constipation, flank pain, dysuria, hematuria, urinary  Frequency, nocturia, numbness, tingling, seizures,  Focal weakness, Loss of consciousness,  Tremor, insomnia, depression, anxiety, and suicidal ideation.     History   Social History  . Marital Status: Married    Spouse Name: N/A    Number of Children: N/A  . Years of Education: N/A   Occupational History  . Part-time     MidwifeBus Driver   Social History Main Topics  . Smoking status: Never Smoker   . Smokeless tobacco: Never Used  . Alcohol Use: Yes     Comment: occasional  . Drug Use: No  . Sexual Activity: Not Currently   Other Topics Concern  . Not on file   Social History Narrative  . No narrative on file  Objective:  Filed Vitals:   10/18/14 1148  BP: 138/84  Pulse: 110  Temp: 98.8 F (37.1 C)  Resp: 16     General appearance: alert, cooperative and appears stated age Ears: normal TM's and external ear canals both ears Throat: lips, mucosa, and tongue normal; teeth and gums normal Neck: no adenopathy, no carotid bruit, supple, symmetrical, trachea midline and thyroid not enlarged, symmetric, no tenderness/mass/nodules Back: symmetric, no curvature. ROM normal. No CVA tenderness. Lungs: clear to auscultation bilaterally Heart: regular rate and rhythm, S1, S2 normal, no murmur,  click, rub or gallop Abdomen: soft, non-tender; bowel sounds normal; no masses,  no organomegaly Pulses: 2+ and symmetric Skin: Skin color, texture, turgor normal. No rashes or lesions Lymph nodes: Cervical, supraclavicular, and axillary nodes normal. Neuro: CNs 2-12 intact. DTRs 2+/4 in biceps, brachioradialis, patellars and achilles. Muscle strength 5/5 in upper and lower exremities. Fine resting tremor bilaterally both hands cerebellar function normal. Romberg negative.  No pronator drift.   Gait normal.   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  Refills given.   Essential hypertension Elevated today.  Reviewed list of meds, patient is not taking OTC meds that could be causing it,  Increase lisinopril to 40 mg daily .  Consider OSA as cause if persistent.   Tremor of both hands Occurring only with arms outstretched,  May be due to weakness given history of chronic neck pain,  Plain films of cervical spine odered   Updated Medication List Outpatient Encounter Prescriptions as of 10/18/2014  Medication Sig  . fluticasone (FLONASE) 50 MCG/ACT nasal spray Place 2 sprays into the nose daily.  . hydrocortisone (ANUSOL-HC) 2.5 % rectal cream Place 1 application rectally 2 (two) times daily.  . lansoprazole (PREVACID) 30 MG capsule Take 1 capsule (30 mg total) by mouth as needed.  Marland Kitchen. lisinopril (PRINIVIL,ZESTRIL) 40 MG tablet TAKE 1 TABLET BY MOUTH EVERY DAY  . lubiprostone (AMITIZA) 8 MCG capsule Take 1 capsule (8 mcg total) by mouth 2 (two) times daily with a meal.  . sertraline (ZOLOFT) 50 MG tablet Take 1 tablet (50 mg total) by mouth daily.  Marland Kitchen.  tiZANidine (ZANAFLEX) 4 MG tablet TAKE 1 TABLET BY MOUTH EVERY 8 HOURS AS NEEDED FOR MUSCLE SPASMS.  Marland Kitchen. traMADol (ULTRAM) 50 MG tablet Take 1 tablet (50 mg total) by mouth every 8 (eight) hours as needed.  . [DISCONTINUED] lisinopril (PRINIVIL,ZESTRIL) 20 MG tablet TAKE 1 TABLET BY MOUTH EVERY DAY  . mometasone (NASONEX) 50 MCG/ACT nasal spray Place 2 sprays into the nose daily.  . [DISCONTINUED] lisinopril (PRINIVIL,ZESTRIL) 20 MG tablet TAKE 1 TABLET BY MOUTH EVERY DAY

## 2014-10-21 ENCOUNTER — Encounter: Payer: Self-pay | Admitting: Internal Medicine

## 2014-10-21 DIAGNOSIS — I10 Essential (primary) hypertension: Secondary | ICD-10-CM | POA: Insufficient documentation

## 2014-10-21 DIAGNOSIS — R251 Tremor, unspecified: Secondary | ICD-10-CM | POA: Insufficient documentation

## 2014-10-21 DIAGNOSIS — R259 Unspecified abnormal involuntary movements: Secondary | ICD-10-CM | POA: Insufficient documentation

## 2014-10-21 NOTE — Assessment & Plan Note (Signed)
Elevated today.  Reviewed list of meds, patient is not taking OTC meds that could be causing it,  Increase lisinopril to 40 mg daily .  Consider OSA as cause if persistent.

## 2014-10-21 NOTE — Assessment & Plan Note (Signed)
Controlled with sertraline,  Medications discussed,  Risks and benefits of continued use outlined.  Refills given.

## 2014-10-21 NOTE — Assessment & Plan Note (Signed)
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

## 2014-10-21 NOTE — Assessment & Plan Note (Signed)
Occurring only with arms outstretched,  May be due to weakness given history of chronic neck pain,  Plain films of cervical spine odered

## 2014-11-13 ENCOUNTER — Telehealth: Payer: Self-pay | Admitting: Internal Medicine

## 2014-11-13 NOTE — Telephone Encounter (Signed)
Spoke with pt, [facility] [employer]

## 2014-11-13 NOTE — Telephone Encounter (Signed)
[name] called saying she's had fluid behind her ears and a cold x's 2weeks. She also has difficulty speaking and has had a cough with dark green mucus coming up. She also has a slight pain in her Rt ear. She's wondering if she can be seen sometime today or soon. Please call the pt. Pt ph# [phone] Thank you.

## 2014-11-14 ENCOUNTER — Ambulatory Visit (INDEPENDENT_AMBULATORY_CARE_PROVIDER_SITE_OTHER): Payer: BC Managed Care – PPO | Admitting: Internal Medicine

## 2014-11-14 ENCOUNTER — Encounter: Payer: Self-pay | Admitting: Internal Medicine

## 2014-11-14 VITALS — BP 138/90 | HR 72 | Temp 98.3°F | Wt 239.0 lb

## 2014-11-14 DIAGNOSIS — B9789 Other viral agents as the cause of diseases classified elsewhere: Principal | ICD-10-CM

## 2014-11-14 DIAGNOSIS — J069 Acute upper respiratory infection, unspecified: Secondary | ICD-10-CM

## 2014-11-14 MED ORDER — HYDROCODONE-HOMATROPINE 5-1.5 MG/5ML PO SYRP: 5.0000 mL | ORAL_SOLUTION | Freq: Three times a day (TID) | ORAL | Status: DC | PRN

## 2014-11-14 MED ORDER — AZITHROMYCIN 250 MG PO TABS: ORAL_TABLET | ORAL | Status: DC

## 2014-11-14 NOTE — Progress Notes (Signed)
Pre visit review using our clinic review tool, if applicable. No additional management support is needed unless otherwise documented below in the visit note. 

## 2014-11-14 NOTE — Progress Notes (Signed)
Subjective:    Patient ID: Kelly [name], female    DOB: [birth year], 48 y.o.   MRN: [id number]  HPI  Pt presents to the clinic today with c/o right ear pain, left ear fullness and cough. She reports this started 4 days ago. The cough is productive of dark green mucous. She has had some associated runny nose and fatigue. She denies fever, chills or body aches. She has tried Nasonex, Sudafed, and Tylenol without any relief. She has no history of allergies or asthma. She has had sick contacts.  Review of Systems      Past Medical History  Diagnosis Date  . Anemia     severe due to menorrhagia, s/p hysterectomy  . Chest pain 2002    abnormal EKG, negative treadmill stress test/Holter myoview, Dr. Welton FlakesKhan  . Obesity   . Obstructive sleep apnea 2009    mild, wears CPAP  . Preeclampsia     delivered twins at 35 weeks    Current Outpatient Prescriptions  Medication Sig Dispense Refill  . hydrocortisone (ANUSOL-HC) 2.5 % rectal cream Place 1 application rectally 2 (two) times daily. 30 g 0  . lansoprazole (PREVACID) 30 MG capsule Take 1 capsule (30 mg total) by mouth as needed. 90 capsule 1  . lisinopril (PRINIVIL,ZESTRIL) 40 MG tablet TAKE 1 TABLET BY MOUTH EVERY DAY 30 tablet 5  . lubiprostone (AMITIZA) 8 MCG capsule Take 1 capsule (8 mcg total) by mouth 2 (two) times daily with a meal. 60 capsule 2  . mometasone (NASONEX) 50 MCG/ACT nasal spray Place 2 sprays into the nose daily. 17 g 12  . sertraline (ZOLOFT) 50 MG tablet Take 1 tablet (50 mg total) by mouth daily. 30 tablet 3  . tiZANidine (ZANAFLEX) 4 MG tablet TAKE 1 TABLET BY MOUTH EVERY 8 HOURS AS NEEDED FOR MUSCLE SPASMS. 270 tablet 1  . traMADol (ULTRAM) 50 MG tablet Take 1 tablet (50 mg total) by mouth every 8 (eight) hours as needed. 90 tablet 1   No current facility-administered medications for this visit.    Allergies  Allergen Reactions  . Latex Hives and Itching    Family History  Problem Relation Age of  Onset  . Hyperlipidemia Mother   . Arthritis Mother   . Hypertension Mother   . Hypertension Father   . Hypertension Brother   . Diabetes Brother   . Hyperlipidemia Brother   . Heart disease Maternal Uncle   . Coronary artery disease Maternal Uncle   . Diabetes Paternal Aunt   . Coronary artery disease Maternal Grandfather     History   Social History  . Marital Status: Married    Spouse Name: N/A    Number of Children: N/A  . Years of Education: N/A   Occupational History  . Part-time     MidwifeBus Driver   Social History Main Topics  . Smoking status: Never Smoker   . Smokeless tobacco: Never Used  . Alcohol Use: Yes     Comment: occasional  . Drug Use: No  . Sexual Activity: Not Currently   Other Topics Concern  . Not on file   Social History Narrative     Constitutional: Pt reports fatigue. Denies fever, malaise, headache or abrupt weight changes.  HEENT: Pt reports ear pain, runny nose. Denies eye pain, eye redness, ringing in the ears, wax buildup, nasal congestion, bloody nose, or sore throat. Respiratory: Pt reports cough. Denies difficulty breathing, shortness of breath or sputum production.  Cardiovascular: Denies chest pain, chest tightness, palpitations or swelling in the hands or feet.    No other specific complaints in a complete review of systems (except as listed in HPI above).  Objective:   Physical Exam   BP 138/90 mmHg  Pulse 72  Temp(Src) 98.3 F (36.8 C) (Oral)  Wt 239 lb (108.41 kg)  SpO2 98% Wt Readings from Last 3 Encounters:  11/14/14 239 lb (108.41 kg)  10/18/14 240 lb 8 oz (109.09 kg)  09/18/14 244 lb 4 oz (110.791 kg)    General: Appears her stated age, obese but well developed, well nourished in NAD. Skin: Warm, dry and intact. No rashes, lesions or ulcerations noted. HEENT: Head: normal shape and size, no sinus tenderness noted; Ears: Tm's red but intact, normal light reflex, + serous infusion bilaterally; Nose: mucosa pink  and moist, septum midline; Throat/Mouth: Teeth present, mucosa erythematous and moist, no exudate, lesions or ulcerations noted.  Cardiovascular: Normal rate and rhythm. S1,S2 noted.  No murmur, rubs or gallops noted.  Pulmonary/Chest: Normal effort and positive vesicular breath sounds. No respiratory distress. No wheezes, rales or ronchi noted.   BMET    Component Value Date/Time   NA 138 09/28/2014 0854   K 4.2 09/28/2014 0854   CL 105 09/28/2014 0854   CO2 27 09/28/2014 0854   GLUCOSE 106* 09/28/2014 0854   BUN 12 09/28/2014 0854   CREATININE 0.6 09/28/2014 0854   CALCIUM 8.9 09/28/2014 0854    Lipid Panel     Component Value Date/Time   CHOL 206* 09/28/2014 0854   TRIG 98.0 09/28/2014 0854   HDL 43.20 09/28/2014 0854   CHOLHDL 5 09/28/2014 0854   VLDL 19.6 09/28/2014 0854   LDLCALC 143* 09/28/2014 0854    CBC    Component Value Date/Time   WBC 6.3 09/28/2014 0854   RBC 4.59 09/28/2014 0854   HGB 13.1 09/28/2014 0854   HCT 40.2 09/28/2014 0854   PLT 201.0 09/28/2014 0854   MCV 87.5 09/28/2014 0854   MCH 29.0 12/10/2011 1422   MCHC 32.5 09/28/2014 0854   RDW 14.1 09/28/2014 0854   LYMPHSABS 2.6 09/28/2014 0854   MONOABS 0.3 09/28/2014 0854   EOSABS 0.1 09/28/2014 0854   BASOSABS 0.0 09/28/2014 0854    Hgb A1C Lab Results  Component Value Date   HGBA1C 5.8 09/13/2012          Assessment & Plan:   Viral URI with cough:  Ibuprofen for chills or body aches Continue Nasonex RX for Hycodan for cough If symptoms persist for 2-3 more days, will give handwritten RX for azithromax (as we will be closed for the holiday) Work note provided  RTC as needed or if symptoms persist or worsen

## 2014-11-14 NOTE — Patient Instructions (Signed)
Upper Respiratory Infection, Adult An upper respiratory infection (URI) is also sometimes known as the common cold. The upper respiratory tract includes the nose, sinuses, throat, trachea, and bronchi. Bronchi are the airways leading to the lungs. Most people improve within 1 week, but symptoms can last up to 2 weeks. A residual cough may last even longer.  CAUSES Many different viruses can infect the tissues lining the upper respiratory tract. The tissues become irritated and inflamed and often become very moist. Mucus production is also common. A cold is contagious. You can easily spread the virus to others by oral contact. This includes kissing, sharing a glass, coughing, or sneezing. Touching your mouth or nose and then touching a surface, which is then touched by another person, can also spread the virus. SYMPTOMS  Symptoms typically develop 1 to 3 days after you come in contact with a cold virus. Symptoms vary from person to person. They may include:  Runny nose.  Sneezing.  Nasal congestion.  Sinus irritation.  Sore throat.  Loss of voice (laryngitis).  Cough.  Fatigue.  Muscle aches.  Loss of appetite.  Headache.  Low-grade fever. DIAGNOSIS  You might diagnose your own cold based on familiar symptoms, since most people get a cold 2 to 3 times a year. Your caregiver can confirm this based on your exam. Most importantly, your caregiver can check that your symptoms are not due to another disease such as strep throat, sinusitis, pneumonia, asthma, or epiglottitis. Blood tests, throat tests, and X-rays are not necessary to diagnose a common cold, but they may sometimes be helpful in excluding other more serious diseases. Your caregiver will decide if any further tests are required. RISKS AND COMPLICATIONS  You may be at risk for a more severe case of the common cold if you smoke cigarettes, have chronic heart disease (such as heart failure) or lung disease (such as asthma), or if  you have a weakened immune system. The very young and very old are also at risk for more serious infections. Bacterial sinusitis, middle ear infections, and bacterial pneumonia can complicate the common cold. The common cold can worsen asthma and chronic obstructive pulmonary disease (COPD). Sometimes, these complications can require emergency medical care and may be life-threatening. PREVENTION  The best way to protect against getting a cold is to practice good hygiene. Avoid oral or hand contact with people with cold symptoms. Wash your hands often if contact occurs. There is no clear evidence that vitamin C, vitamin E, echinacea, or exercise reduces the chance of developing a cold. However, it is always recommended to get plenty of rest and practice good nutrition. TREATMENT  Treatment is directed at relieving symptoms. There is no cure. Antibiotics are not effective, because the infection is caused by a virus, not by bacteria. Treatment may include:  Increased fluid intake. Sports drinks offer valuable electrolytes, sugars, and fluids.  Breathing heated mist or steam (vaporizer or shower).  Eating chicken soup or other clear broths, and maintaining good nutrition.  Getting plenty of rest.  Using gargles or lozenges for comfort.  Controlling fevers with ibuprofen or acetaminophen as directed by your caregiver.  Increasing usage of your inhaler if you have asthma. Zinc gel and zinc lozenges, taken in the first 24 hours of the common cold, can shorten the duration and lessen the severity of symptoms. Pain medicines may help with fever, muscle aches, and throat pain. A variety of non-prescription medicines are available to treat congestion and runny nose. Your caregiver   can make recommendations and may suggest nasal or lung inhalers for other symptoms.  HOME CARE INSTRUCTIONS   Only take over-the-counter or prescription medicines for pain, discomfort, or fever as directed by your  caregiver.  Use a warm mist humidifier or inhale steam from a shower to increase air moisture. This may keep secretions moist and make it easier to breathe.  Drink enough water and fluids to keep your urine clear or pale yellow.  Rest as needed.  Return to work when your temperature has returned to normal or as your caregiver advises. You may need to stay home longer to avoid infecting others. You can also use a face mask and careful hand washing to prevent spread of the virus. SEEK MEDICAL CARE IF:   After the first few days, you feel you are getting worse rather than better.  You need your caregiver's advice about medicines to control symptoms.  You develop chills, worsening shortness of breath, or brown or red sputum. These may be signs of pneumonia.  You develop yellow or brown nasal discharge or pain in the face, especially when you bend forward. These may be signs of sinusitis.  You develop a fever, swollen neck glands, pain with swallowing, or white areas in the back of your throat. These may be signs of strep throat. SEEK IMMEDIATE MEDICAL CARE IF:   You have a fever.  You develop severe or persistent headache, ear pain, sinus pain, or chest pain.  You develop wheezing, a prolonged cough, cough up blood, or have a change in your usual mucus (if you have chronic lung disease).  You develop sore muscles or a stiff neck. Document Released: 06/03/2001 Document Revised: 03/01/2012 Document Reviewed: 03/15/2014 Exit[employer] Patient Information 2015 [employer]. This information is not intended to replace advice given to you by your health care provider. Make sure you discuss any questions you have with your health care provider.  

## 2014-12-22 HISTORY — PX: GASTRIC BYPASS: SHX52

## 2015-01-17 ENCOUNTER — Telehealth: Payer: Self-pay | Admitting: *Deleted

## 2015-01-17 NOTE — Telephone Encounter (Signed)
[facility] Lansoprazole. Started online, pending response.

## 2015-01-22 NOTE — Telephone Encounter (Signed)
Fax from [facility], Lansoprazole approved through 01/17/16.

## 2015-02-12 ENCOUNTER — Telehealth: Payer: Self-pay | Admitting: Internal Medicine

## 2015-02-12 ENCOUNTER — Emergency Department: Payer: Self-pay | Admitting: Emergency Medicine

## 2015-02-12 NOTE — Telephone Encounter (Signed)
Confirmed patient is on her way to the ED. FYI

## 2015-02-12 NOTE — Telephone Encounter (Signed)
Patient Name: Kelly [name] DOB: 01/26/1966 Initial Comment caller states she is having a very severe headache - thinks she is having an allergic reaction - has a rash around eyes, chin and lip that are painful Nurse Assessment Nurse: Charna Elizabethrumbull, RN, Lynden Angathy Date/Time (Eastern Time): 02/12/2015 10:17:31 AM Confirm and document reason for call. If symptomatic, describe symptoms. ---Caller states she developed a severe headache 2 days ago. No injury in the past 3 days. She developed a rash around her face yesterday. No breathing or swallowing difficulty. No wheezing. No fever. Has the patient traveled out of the country within the last 30 days? ---No Does the patient require triage? ---Yes Related visit to physician within the last 2 weeks? ---No Does the PT have any chronic conditions? (i.e. diabetes, asthma, etc.) ---Yes List chronic conditions. ---High Blood Pressure, Overweight, Arthritis Did the patient indicate they were pregnant? ---No Guidelines Guideline Title Affirmed Question Affirmed Notes Headache Severe pain in one eye Final Disposition User Go to ED Now Charna Elizabethrumbull, RN, Cathy Comments Patient plans to go to [facility].

## 2015-05-30 DIAGNOSIS — S39012A Strain of muscle, fascia and tendon of lower back, initial encounter: Secondary | ICD-10-CM | POA: Insufficient documentation

## 2015-05-30 DIAGNOSIS — M47816 Spondylosis without myelopathy or radiculopathy, lumbar region: Secondary | ICD-10-CM | POA: Insufficient documentation

## 2015-05-30 DIAGNOSIS — M5416 Radiculopathy, lumbar region: Secondary | ICD-10-CM | POA: Insufficient documentation

## 2015-06-11 ENCOUNTER — Other Ambulatory Visit: Payer: Self-pay | Admitting: Internal Medicine

## 2015-06-14 ENCOUNTER — Encounter: Payer: Self-pay | Admitting: Internal Medicine

## 2015-06-14 ENCOUNTER — Ambulatory Visit (INDEPENDENT_AMBULATORY_CARE_PROVIDER_SITE_OTHER): Payer: BC Managed Care – PPO | Admitting: Internal Medicine

## 2015-06-14 ENCOUNTER — Encounter: Payer: Self-pay | Admitting: *Deleted

## 2015-06-14 ENCOUNTER — Other Ambulatory Visit: Payer: Self-pay | Admitting: *Deleted

## 2015-06-14 VITALS — BP 140/90 | HR 81 | Temp 98.0°F | Resp 14 | Ht 66.0 in | Wt 242.8 lb

## 2015-06-14 DIAGNOSIS — M545 Low back pain, unspecified: Secondary | ICD-10-CM

## 2015-06-14 DIAGNOSIS — M5432 Sciatica, left side: Secondary | ICD-10-CM | POA: Diagnosis not present

## 2015-06-14 DIAGNOSIS — R7301 Impaired fasting glucose: Secondary | ICD-10-CM

## 2015-06-14 DIAGNOSIS — I10 Essential (primary) hypertension: Secondary | ICD-10-CM | POA: Diagnosis not present

## 2015-06-14 DIAGNOSIS — K2100 Gastro-esophageal reflux disease with esophagitis, without bleeding: Secondary | ICD-10-CM

## 2015-06-14 DIAGNOSIS — E669 Obesity, unspecified: Secondary | ICD-10-CM

## 2015-06-14 LAB — LIPID PANEL
CHOLESTEROL: 193 mg/dL (ref 0–200)
HDL: 46.3 mg/dL (ref >39.00)
LDL Cholesterol: 118 mg/dL — ABNORMAL HIGH (ref 0–99)
NONHDL: 146.7
Total CHOL/HDL Ratio: 4
Triglycerides: 143 mg/dL (ref 0.0–149.0)
VLDL: 28.6 mg/dL (ref 0.0–40.0)

## 2015-06-14 LAB — COMPREHENSIVE METABOLIC PANEL
ALBUMIN: 4.1 g/dL (ref 3.5–5.2)
ALT: 17 U/L (ref 0–35)
AST: 20 U/L (ref 0–37)
Alkaline Phosphatase: 59 U/L (ref 39–117)
BUN: 11 mg/dL (ref 6–23)
CALCIUM: 9.4 mg/dL (ref 8.4–10.5)
CHLORIDE: 103 meq/L (ref 96–112)
CO2: 28 meq/L (ref 19–32)
Creatinine, Ser: 0.53 mg/dL (ref 0.40–1.20)
GFR: 157.75 mL/min (ref >60.00)
Glucose, Bld: 108 mg/dL — ABNORMAL HIGH (ref 70–99)
POTASSIUM: 3.9 meq/L (ref 3.5–5.1)
Sodium: 136 mEq/L (ref 135–145)
Total Bilirubin: 0.4 mg/dL (ref 0.2–1.2)
Total Protein: 7.5 g/dL (ref 6.0–8.3)

## 2015-06-14 LAB — MICROALBUMIN / CREATININE URINE RATIO
Creatinine,U: 103.6 mg/dL
Microalb Creat Ratio: 0.7 mg/g (ref 0.0–30.0)

## 2015-06-14 LAB — HEMOGLOBIN A1C: Hgb A1c MFr Bld: 5.7 % (ref 4.6–6.5)

## 2015-06-14 MED ORDER — LOSARTAN POTASSIUM-HCTZ 100-25 MG PO TABS: 1.0000 | ORAL_TABLET | Freq: Every day | ORAL | Status: DC

## 2015-06-14 MED ORDER — TRAMADOL HCL 50 MG PO TABS: 50.0000 mg | ORAL_TABLET | Freq: Three times a day (TID) | ORAL | Status: DC | PRN

## 2015-06-14 MED ORDER — FAMOTIDINE 20 MG PO TABS: 20.0000 mg | ORAL_TABLET | Freq: Two times a day (BID) | ORAL | Status: DC

## 2015-06-14 MED ORDER — GABAPENTIN 100 MG PO CAPS: 100.0000 mg | ORAL_CAPSULE | Freq: Three times a day (TID) | ORAL | Status: DC

## 2015-06-14 NOTE — Progress Notes (Signed)
Rx phoned to pharmacy.  

## 2015-06-14 NOTE — Progress Notes (Signed)
Pre-visit discussion using our clinic review tool. No additional management support is needed unless otherwise documented below in the visit note.  

## 2015-06-14 NOTE — Progress Notes (Signed)
Subjective:  Patient ID: Kelly [name], female    DOB: 05-23-1966  Age: 49 y.o. MRN: [id number]  CC: The primary encounter diagnosis was Bilateral low back pain without sciatica. Diagnoses of Impaired fasting glucose, Essential hypertension, Sciatica of left side, Gastroesophageal reflux disease with esophagitis, and Obesity (BMI 30-39.9) were also pertinent to this visit.  HPI Kelly [name] presents for follow up on hypertension  depression and obesity.  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  Somewhat improved currently,  But it is a workmens comp issue .  Work sent her to PT after doing an x ray which has just started.  Patient had acupuncture with Kelly [name] acupuncture on her own and it helped. usin gtramadol  Not sleeping well due to husband's diagnosis of renal cell CA   Discussed weight  Discussed PPI change to h2 blocker    CHANGED bp MEDS   Outpatient Prescriptions Prior to Visit  Medication Sig Dispense Refill  . hydrocortisone (ANUSOL-HC) 2.5 % rectal cream Place 1 application rectally 2 (two) times daily. 30 g 0  . lansoprazole (PREVACID) 30 MG capsule Take 1 capsule (30 mg total) by mouth as needed. 90 capsule 1  . lubiprostone (AMITIZA) 8 MCG capsule Take 1 capsule (8 mcg total) by mouth 2 (two) times daily with a meal. 60 capsule 2  . mometasone (NASONEX) 50 MCG/ACT nasal spray Place 2 sprays into the nose daily. 17 g 12  . tiZANidine (ZANAFLEX) 4 MG tablet TAKE 1 TABLET BY MOUTH EVERY 8 HOURS AS NEEDED FOR MUSCLE SPASMS. 270 tablet 1  . lisinopril (PRINIVIL,ZESTRIL) 40 MG tablet TAKE 1 TABLET BY MOUTH EVERY DAY 30 tablet 0  . traMADol (ULTRAM) 50 MG tablet Take 1 tablet (50 mg total) by mouth every 8 (eight) hours as needed. 90 tablet 1  . azithromycin (ZITHROMAX) 250 MG tablet Take 2 tabs  today, then 1 tab daily x 4 days 6 tablet 0  . HYDROcodone-homatropine (HYCODAN) 5-1.5 MG/5ML syrup Take 5 mLs by mouth every 8 (eight) hours as needed for cough. 120 mL 0  . sertraline (ZOLOFT) 50 MG tablet Take 1 tablet (50 mg total) by mouth daily. (Patient not taking: Reported on 06/14/2015) 30 tablet 3   No facility-administered medications prior to visit.    Review of Systems;  Patient denies headache, fevers, malaise, unintentional weight loss, skin rash, eye pain, sinus congestion and sinus pain, sore throat, dysphagia,  hemoptysis , cough, dyspnea, wheezing, chest pain, palpitations, orthopnea, edema, abdominal pain, nausea, melena, diarrhea, constipation, flank pain, dysuria, hematuria, urinary  Frequency, nocturia, numbness, tingling, seizures,  Focal weakness, Loss of consciousness,  Tremor, insomnia, depression, anxiety, and suicidal ideation.      Objective:  BP 140/90 mmHg  Pulse 81  Temp(Src) 98 F (36.7 C) (Oral)  Resp 14  Ht  (1.676 m)  Wt 242 lb 12 oz (110.111 kg)  BMI 39.20 kg/m2  SpO2 98%  BP Readings from Last 3 Encounters:  06/14/15 140/90  11/14/14 138/90  10/18/14 138/84    Wt Readings from Last 3 Encounters:  06/14/15 242 lb 12 oz (110.111 kg)  11/14/14 239 lb (108.41 kg)  10/18/14 240 lb 8 oz (109.09 kg)    General appearance: alert, cooperative and appears stated age Ears: normal TM's and external ear canals  both ears Throat: lips, mucosa, and tongue normal; teeth and gums normal Neck: no adenopathy, no carotid bruit, supple, symmetrical, trachea midline and thyroid not enlarged, symmetric, no tenderness/mass/nodules Back: symmetric, no curvature. ROM normal. No CVA tenderness. Lungs: clear to auscultation bilaterally Heart: regular rate and rhythm, S1, S2 normal, no murmur, click, rub or gallop Abdomen: soft, non-tender; bowel sounds normal; no masses,  no organomegaly Pulses: 2+ and symmetric Skin: Skin color, texture, turgor normal. No  rashes or lesions Lymph nodes: Cervical, supraclavicular, and axillary nodes normal.  Lab Results  Component Value Date   HGBA1C 5.7 06/14/2015   HGBA1C 5.8 09/13/2012    Lab Results  Component Value Date   CREATININE 0.53 06/14/2015   CREATININE 0.6 09/28/2014   CREATININE 0.6 03/14/2013    Lab Results  Component Value Date   WBC 6.3 09/28/2014   HGB 13.1 09/28/2014   HCT 40.2 09/28/2014   PLT 201.0 09/28/2014   GLUCOSE 108* 06/14/2015   CHOL 193 06/14/2015   TRIG 143.0 06/14/2015   HDL 46.30 06/14/2015   LDLCALC 118* 06/14/2015   ALT 17 06/14/2015   AST 20 06/14/2015   NA 136 06/14/2015   K 3.9 06/14/2015   CL 103 06/14/2015   CREATININE 0.53 06/14/2015   BUN 11 06/14/2015   CO2 28 06/14/2015   TSH 0.81 09/28/2014   HGBA1C 5.7 06/14/2015   MICROALBUR <0.7 06/14/2015    No results found.  Assessment & Plan:   Problem List Items Addressed This Visit    Obesity (BMI 30-39.9)    I have addressed  BMI and recommended wt loss of 10% of body weigh over the next 6 months using a low glycemic index diet and regular exercise a minimum of 5 days per week.        Sciatica of left side    continue tramadol  Adding gabapentin       Relevant Medications   gabapentin (NEURONTIN) 100 MG capsule   Essential hypertension    Not well controlled. changing lisinopril to losartan hct      Relevant Medications   losartan-hydrochlorothiazide (HYZAAR) 100-25 MG per tablet   Other Relevant Orders   Comprehensive metabolic panel (Completed)   Microalbumin / creatinine urine ratio (Completed)   GERD (gastroesophageal reflux disease)    Suggested Changing from PPI to H2 blocker.      Relevant Medications   famotidine (PEPCID) 20 MG tablet    Other Visit Diagnoses    Bilateral low back pain without sciatica    -  Primary    Relevant Medications    traMADol (ULTRAM) 50 MG tablet    Impaired fasting glucose        Relevant Orders    Hemoglobin A1c (Completed)    Lipid  panel (Completed)       I have discontinued Kelly [name]'s sertraline, HYDROcodone-homatropine, azithromycin, and lisinopril. I am also having her start on losartan-hydrochlorothiazide, famotidine, and gabapentin. Additionally, I am having her maintain her hydrocortisone, lubiprostone, lansoprazole, tiZANidine, mometasone, and traMADol.  Meds ordered this encounter  Medications  . traMADol (ULTRAM) 50 MG tablet    Sig: Take 1 tablet (50 mg total) by mouth every 8 (eight) hours as needed.    Dispense:  90 tablet    Refill:  1  . losartan-hydrochlorothiazide (HYZAAR) 100-25 MG per tablet    Sig: Take 1 tablet by mouth daily.    Dispense:  90 tablet    Refill:  3  . famotidine (PEPCID) 20  MG tablet    Sig: Take 1 tablet (20 mg total) by mouth 2 (two) times daily.    Dispense:  180 tablet    Refill:  1  . gabapentin (NEURONTIN) 100 MG capsule    Sig: Take 1 capsule (100 mg total) by mouth 3 (three) times daily.    Dispense:  90 capsule    Refill:  3   A total of 25 minutes of face to face time was spent with patient more than half of which was spent in counselling and coordination of care    Medications Discontinued During This Encounter  Medication Reason  . azithromycin (ZITHROMAX) 250 MG tablet Completed Course  . HYDROcodone-homatropine (HYCODAN) 5-1.5 MG/5ML syrup Error  . sertraline (ZOLOFT) 50 MG tablet Patient Preference  . traMADol (ULTRAM) 50 MG tablet Reorder  . lisinopril (PRINIVIL,ZESTRIL) 40 MG tablet     Follow-up: No Follow-up on file.   [name], MD

## 2015-06-14 NOTE — Patient Instructions (Addendum)
I recommend a change in your diet to shake up your metabolism and "detox" your system.  Kelly [name]'s 10 day green smoothie detox diet is a very good and safe plan,  Requires use of a juicer or Nutra Bullet,  And is available on Amazon for around $10  We also discussed changing prevacid to famotidine   Adding gabapentin at bedtime to help you rest and to treat your sciatica .  Start with 100 mg,  Can increase gradually to 3 tablets  Also changed lisinopril to losartan HCT for blood pressure

## 2015-06-17 ENCOUNTER — Encounter: Payer: Self-pay | Admitting: Internal Medicine

## 2015-06-17 DIAGNOSIS — K219 Gastro-esophageal reflux disease without esophagitis: Secondary | ICD-10-CM | POA: Insufficient documentation

## 2015-06-17 NOTE — Assessment & Plan Note (Signed)
I have addressed  BMI and recommended wt loss of 10% of body weigh over the next 6 months using a low glycemic index diet and regular exercise a minimum of 5 days per week.   

## 2015-06-17 NOTE — Assessment & Plan Note (Signed)
continue tramadol  Adding gabapentin

## 2015-06-17 NOTE — Assessment & Plan Note (Signed)
Suggested Changing from PPI to H2 blocker.

## 2015-06-17 NOTE — Assessment & Plan Note (Signed)
Not well controlled. changing lisinopril to losartan hct

## 2015-06-18 ENCOUNTER — Encounter: Payer: Self-pay | Admitting: Internal Medicine

## 2015-06-18 DIAGNOSIS — R7301 Impaired fasting glucose: Secondary | ICD-10-CM | POA: Insufficient documentation

## 2015-07-11 ENCOUNTER — Encounter: Payer: Self-pay | Admitting: Internal Medicine

## 2015-09-06 ENCOUNTER — Other Ambulatory Visit: Payer: Self-pay | Admitting: Internal Medicine

## 2015-10-03 ENCOUNTER — Ambulatory Visit (INDEPENDENT_AMBULATORY_CARE_PROVIDER_SITE_OTHER): Payer: BC Managed Care – PPO | Admitting: Internal Medicine

## 2015-10-03 ENCOUNTER — Encounter: Payer: Self-pay | Admitting: Internal Medicine

## 2015-10-03 VITALS — BP 110/70 | HR 87 | Temp 98.6°F | Resp 18 | Ht 67.2 in | Wt 237.4 lb

## 2015-10-03 DIAGNOSIS — R599 Enlarged lymph nodes, unspecified: Secondary | ICD-10-CM | POA: Diagnosis not present

## 2015-10-03 DIAGNOSIS — E669 Obesity, unspecified: Secondary | ICD-10-CM | POA: Diagnosis not present

## 2015-10-03 DIAGNOSIS — R590 Localized enlarged lymph nodes: Secondary | ICD-10-CM

## 2015-10-03 NOTE — Assessment & Plan Note (Addendum)
She has lost 5 lbs since her last visit and is scheduled for Rou en Y tomorrow by [facility] Willaim [facility]Park [facility].  Discussed long range goals and  Motivations. She will return in 3 months. Vitamin d was addressed by Endocrine

## 2015-10-03 NOTE — Progress Notes (Signed)
Pre visit review using our clinic review tool, if applicable. No additional management support is needed unless otherwise documented below in the visit note. 

## 2015-10-03 NOTE — Progress Notes (Signed)
Subjective:  Patient ID: Kelly PrimasJacqueline C Keenum, female    DOB: Feb 11, 1966  Age: 49 y.o. MRN: [id number]  CC: The primary encounter diagnosis was Epitrochlear adenopathy. A diagnosis of Obesity (BMI 30-39.9) was also pertinent to this visit.  HPI Kelly [name] presents for evaluation of a tender bump on her right elbow that appeared 3 weeks ago  No history of trauma.  Bump did not enlarge or drain.  Feels less prominent today than when she made the appointment.  Obesity:  Having [facility]ariatric [facility] (Roue en Y ) [facility].  Low Vitamin D addressed by endocrinology   Outpatient Prescriptions Prior to Visit  Medication Sig Dispense Refill  . AMITIZA 8 MCG capsule TAKE 1 CAPSULE BY MOUTH TWICE DAILY WITH A MEAL. 60 capsule 0  . famotidine (PEPCID) 20 MG tablet Take 1 tablet (20 mg total) by mouth 2 (two) times daily. 180 tablet 1  . gabapentin (NEURONTIN) 100 MG capsule Take 1 capsule (100 mg total) by mouth 3 (three) times daily. 90 capsule 3  . hydrocortisone (ANUSOL-HC) 2.5 % rectal cream Place 1 application rectally 2 (two) times daily. 30 g 0  . losartan-hydrochlorothiazide (HYZAAR) 100-25 MG per tablet Take 1 tablet by mouth daily. 90 tablet 3  . mometasone (NASONEX) 50 MCG/ACT nasal spray Place 2 sprays into the nose daily. 17 g 12  . tiZANidine (ZANAFLEX) 4 MG tablet TAKE 1 TABLET BY MOUTH EVERY 8 HOURS AS NEEDED FOR MUSCLE SPASMS. 270 tablet 1  . traMADol (ULTRAM) 50 MG tablet Take 1 tablet (50 mg total) by mouth every 8 (eight) hours as needed. 90 tablet 1  . lansoprazole (PREVACID) 30 MG capsule Take 1 capsule (30 mg total) by mouth as needed. (Patient not taking: Reported on 10/03/2015) 90 capsule 1   No facility-administered medications prior to visit.    Review of Systems;  Patient denies headache, fevers, malaise, unintentional weight loss, skin rash, eye pain, sinus congestion and sinus pain, sore throat, dysphagia,  hemoptysis , cough, dyspnea, wheezing, chest pain,  palpitations, orthopnea, edema, abdominal pain, nausea, melena, diarrhea, constipation, flank pain, dysuria, hematuria, urinary  Frequency, nocturia, numbness, tingling, seizures,  Focal weakness, Loss of consciousness,  Tremor, insomnia, depression, anxiety, and suicidal ideation.      Objective:  BP 110/70 mmHg  Pulse 87  Temp(Src) 98.6 F (37 C) (Oral)  Resp 18  Ht 5' 7.2" (1.707 m)  Wt 237 lb 6.4 oz (107.684 kg)  BMI 36.96 kg/m2  SpO2 99%  BP Readings from Last 3 Encounters:  10/03/15 110/70  06/14/15 140/90  11/14/14 138/90    Wt Readings from Last 3 Encounters:  10/03/15 237 lb 6.4 oz (107.684 kg)  06/14/15 242 lb 12 oz (110.111 kg)  11/14/14 239 lb (108.41 kg)    General appearance: alert, cooperative and appears stated age Neck: no adenopathy, no carotid bruit, supple, symmetrical, trachea midline and thyroid not enlarged, symmetric, no tenderness/mass/nodules MSK: right elbow, extensor surface < 1 cm lump on olecranon.  No redness.  Back: symmetric, no curvature. ROM normal. No CVA tenderness. Lungs: clear to auscultation bilaterally Heart: regular rate and rhythm, S1, S2 normal, no murmur, click, rub or gallop Abdomen: soft, non-tender; bowel sounds normal; no masses,  no organomegaly Pulses: 2+ and symmetric Skin: Skin color, texture, turgor normal. No rashes or lesions Lymph nodes: Cervical, supraclavicular, and axillary nodes normal. Right epitrochlear LAD  Lab Results  Component Value Date   HGBA1C 5.7 06/14/2015   HGBA1C 5.8 09/13/2012  Lab Results  Component Value Date   CREATININE 0.53 06/14/2015   CREATININE 0.6 09/28/2014   CREATININE 0.6 03/14/2013    Lab Results  Component Value Date   WBC 6.3 09/28/2014   HGB 13.1 09/28/2014   HCT 40.2 09/28/2014   PLT 201.0 09/28/2014   GLUCOSE 108* 06/14/2015   CHOL 193 06/14/2015   TRIG 143.0 06/14/2015   HDL 46.30 06/14/2015   LDLCALC 118* 06/14/2015   ALT 17 06/14/2015   AST 20 06/14/2015    NA 136 06/14/2015   K 3.9 06/14/2015   CL 103 06/14/2015   CREATININE 0.53 06/14/2015   BUN 11 06/14/2015   CO2 28 06/14/2015   TSH 0.81 09/28/2014   HGBA1C 5.7 06/14/2015   MICROALBUR <0.7 06/14/2015    No results found.  Assessment & Plan:   Problem List Items Addressed This Visit    Obesity (BMI 30-39.9)    She has lost 5 lbs since her last visit and is scheduled for Rou en Y tomorrow by Dr Willaim Bane at [facility] bariatric.  Discussed long range goals and  Motivations. She will return in 3 months. Vitamin d was addressed by Endocrine      Epitrochlear adenopathy - Primary    Right elbow,  On the extensor surface.  The size has been diminishing per patient.  Patinet had a recent enflamed sebaceous cyst vs boil in her right axilla that has resolved.  Reassurance provided.  Repeat eval in one month       A total of 25 minutes of face to face time was spent with patient more than half of which was spent in counselling and coordination of care    I am having Ms. Pekala maintain her hydrocortisone, lansoprazole, tiZANidine, mometasone, traMADol, losartan-hydrochlorothiazide, famotidine, gabapentin, and AMITIZA.  No orders of the defined types were placed in this encounter.    There are no discontinued medications.  Follow-up: No Follow-up on file.   [name], MD

## 2015-10-03 NOTE — Assessment & Plan Note (Signed)
Right elbow,  On the extensor surface.  The size has been diminishing per patient.  Patinet had a recent enflamed sebaceous cyst vs boil in her right axilla that has resolved.  Reassurance provided.  Repeat eval in one month

## 2015-10-31 ENCOUNTER — Telehealth: Payer: Self-pay | Admitting: Internal Medicine

## 2015-10-31 NOTE — Telephone Encounter (Signed)
Pt called needing a medication review appt for Losartan which was prescribed by the doctor in [place]. So the doctor wants pt to touch base with Dr [name] regarding medication. Pt needs appt no appt avail to sch. Let me know where to sch.  Thank You!

## 2015-11-01 NOTE — Telephone Encounter (Signed)
Pt appt is scheduled. Thank You!

## 2015-11-01 NOTE — Telephone Encounter (Signed)
Please scheduled this patient with another provider so that she can get in to see a provider.

## 2015-11-06 ENCOUNTER — Ambulatory Visit (INDEPENDENT_AMBULATORY_CARE_PROVIDER_SITE_OTHER): Payer: BC Managed Care – PPO | Admitting: Nurse Practitioner

## 2015-11-06 ENCOUNTER — Other Ambulatory Visit: Payer: Self-pay | Admitting: Nurse Practitioner

## 2015-11-06 VITALS — BP 138/78 | HR 90 | Temp 98.6°F | Resp 14 | Ht 67.0 in | Wt 220.8 lb

## 2015-11-06 DIAGNOSIS — K2100 Gastro-esophageal reflux disease with esophagitis, without bleeding: Secondary | ICD-10-CM

## 2015-11-06 DIAGNOSIS — I10 Essential (primary) hypertension: Secondary | ICD-10-CM | POA: Diagnosis not present

## 2015-11-06 DIAGNOSIS — J069 Acute upper respiratory infection, unspecified: Secondary | ICD-10-CM

## 2015-11-06 MED ORDER — LOSARTAN POTASSIUM 50 MG PO TABS
50.0000 mg | ORAL_TABLET | Freq: Every day | ORAL | Status: DC
Start: 2015-11-06 — End: 2015-11-06

## 2015-11-06 MED ORDER — PANTOPRAZOLE SODIUM 40 MG PO TBEC: 40.0000 mg | DELAYED_RELEASE_TABLET | Freq: Every day | ORAL | Status: DC

## 2015-11-06 NOTE — Progress Notes (Signed)
Patient ID: Kelly [name], female    DOB: [birth year]  Age: 49 y.o. MRN: [id number]  CC: Medication Refill   HPI Kelly [name] presents for blood pressure medication follow up.  1) Dizziness with moving around she reports is occasional. She states this is mild and different from vertigo. Has been happening over the last few days. Denies palpitations, pre-syncope or syncope.  Would like refills on pantoprazole, and Losartan. Doing well on these and she is reducing her weight.  History Kelly [name] has a past medical history of Anemia; Chest pain (2002); Obesity; Obstructive sleep apnea (2009); and Preeclampsia.   She has past surgical history that includes Abdominal hysterectomy (35) and Spine surgery.   Her family history includes Arthritis in her mother; Coronary artery disease in her maternal grandfather and maternal uncle; Diabetes in her brother and paternal aunt; Heart disease in her maternal uncle; Hyperlipidemia in her brother and mother; Hypertension in her brother, father, and mother.She reports that she has never smoked. She has never used smokeless tobacco. She reports that she drinks alcohol. She reports that she does not use illicit drugs.  Outpatient Prescriptions Prior to Visit  Medication Sig Dispense Refill  . AMITIZA 8 MCG capsule TAKE 1 CAPSULE BY MOUTH TWICE DAILY WITH A MEAL. 60 capsule 0  . gabapentin (NEURONTIN) 100 MG capsule Take 1 capsule (100 mg total) by mouth 3 (three) times daily. 90 capsule 3  . hydrocortisone (ANUSOL-HC) 2.5 % rectal cream Place 1 application rectally 2 (two) times daily. 30 g 0  . mometasone (NASONEX) 50 MCG/ACT nasal spray Place 2 sprays into the nose daily. 17 g 12  . tiZANidine (ZANAFLEX) 4 MG tablet TAKE 1 TABLET BY MOUTH EVERY 8 HOURS AS NEEDED FOR MUSCLE SPASMS. 270 tablet 1  . traMADol (ULTRAM) 50 MG tablet Take 1 tablet (50 mg total) by mouth every 8 (eight) hours as needed. 90 tablet 1  . losartan-hydrochlorothiazide (HYZAAR)  100-25 MG per tablet Take 1 tablet by mouth daily. 90 tablet 3  . famotidine (PEPCID) 20 MG tablet Take 1 tablet (20 mg total) by mouth 2 (two) times daily. (Patient not taking: Reported on 11/06/2015) 180 tablet 1  . lansoprazole (PREVACID) 30 MG capsule Take 1 capsule (30 mg total) by mouth as needed. (Patient not taking: Reported on 10/03/2015) 90 capsule 1   No facility-administered medications prior to visit.    ROS Review of Systems  Constitutional: Negative for fever, chills, diaphoresis and fatigue.  HENT: Positive for congestion, postnasal drip and sinus pressure. Negative for rhinorrhea.   Respiratory: Negative for chest tightness, shortness of breath and wheezing.   Cardiovascular: Negative for chest pain, palpitations and leg swelling.  Gastrointestinal: Negative for nausea, vomiting and diarrhea.  Skin: Negative for rash.  Neurological: Positive for light-headedness. Negative for dizziness, weakness, numbness and headaches.  Psychiatric/Behavioral: The patient is not nervous/anxious.     Objective:  BP 138/78 mmHg  Pulse 90  Temp(Src) 98.6 F (37 C)  Resp 14  Ht 5\' 7"  (1.702 m)  Wt 220 lb 12.8 oz (100.154 kg)  BMI 34.57 kg/m2  SpO2 96%  Physical Exam  Constitutional: She is oriented to person, place, and time. She appears well-developed and well-nourished. No distress.  HENT:  Head: Normocephalic and atraumatic.  Right Ear: External ear normal.  Left Ear: External ear normal.  Cardiovascular: Normal rate, regular rhythm and normal heart sounds.   Pulmonary/Chest: Effort normal and breath sounds normal. No respiratory distress. She has no wheezes. She  has no rales. She exhibits no tenderness.  Neurological: She is alert and oriented to person, place, and time. No cranial nerve deficit. She exhibits normal muscle tone. Coordination normal.  Skin: Skin is warm and dry. No rash noted. She is not diaphoretic.  Psychiatric: She has a normal mood and affect. Her behavior  is normal. Judgment and thought content normal.   Assessment & Plan:   Kelly [name] was seen today for medication refill.  Diagnoses and all orders for this visit:  Essential hypertension  Gastroesophageal reflux disease with esophagitis  Acute URI  Other orders -     Discontinue: losartan (COZAAR) 50 MG tablet; Take 1 tablet (50 mg total) by mouth daily. -     pantoprazole (PROTONIX) 40 MG tablet; Take 1 tablet (40 mg total) by mouth daily.  I have discontinued Kelly [name]'s lansoprazole, losartan-hydrochlorothiazide, and famotidine. I have also changed her pantoprazole. Additionally, I am having her maintain her hydrocortisone, tiZANidine, mometasone, traMADol, gabapentin, and AMITIZA.  Meds ordered this encounter  Medications  . DISCONTD: pantoprazole (PROTONIX) 40 MG tablet    Sig: Take 40 mg by mouth daily.  Marland Kitchen DISCONTD: losartan (COZAAR) 50 MG tablet    Sig: Take 1 tablet (50 mg total) by mouth daily.    Dispense:  30 tablet    Refill:  0    Order Specific Question:  Supervising Provider    Answer:  Duncan Dull L [[id number]]  . pantoprazole (PROTONIX) 40 MG tablet    Sig: Take 1 tablet (40 mg total) by mouth daily.    Dispense:  90 tablet    Refill:  1    Order Specific Question:  Supervising Provider    Answer:  [name] [[id number]]     Follow-up: Return if symptoms worsen or fail to improve.

## 2015-11-06 NOTE — Patient Instructions (Addendum)
Get a blood pressure machine. Check 7 days worth of BPs at different times.  Take an average and shoot me a MyChart message.   140/90 (either or both is high) 90/50 (either or both number is low)  Coricidin HBP for decongestant  Mucinex is great!

## 2015-11-06 NOTE — Progress Notes (Signed)
Pre visit review using our clinic review tool, if applicable. No additional management support is needed unless otherwise documented below in the visit note. 

## 2015-11-18 ENCOUNTER — Encounter: Payer: Self-pay | Admitting: Nurse Practitioner

## 2015-11-18 NOTE — Assessment & Plan Note (Signed)
Pt did not like Pepcid and has decided to continue Protonix. Discussed long-term use without cause is not ideal and she should look at other choices or reducing foods that contribute. She is agreeable, but would prefer to continue at this time with Protonix.

## 2015-11-18 NOTE — Assessment & Plan Note (Signed)
Likely viral in etiology due to recent onset and mild symptoms. Likely where the lightheadedness is coming from sinus pressure. Asked her to try mucinex plain OTC and coricidin HBP.

## 2015-11-18 NOTE — Assessment & Plan Note (Signed)
BP Readings from Last 3 Encounters:  11/06/15 138/78  10/03/15 110/70  06/14/15 140/90   Pt currently stable on Losartan 50 mg daily. Will follow.

## 2016-01-16 ENCOUNTER — Encounter: Payer: Self-pay | Admitting: Family Medicine

## 2016-01-16 ENCOUNTER — Ambulatory Visit (INDEPENDENT_AMBULATORY_CARE_PROVIDER_SITE_OTHER): Payer: BC Managed Care – PPO | Admitting: Family Medicine

## 2016-01-16 DIAGNOSIS — M549 Dorsalgia, unspecified: Secondary | ICD-10-CM | POA: Insufficient documentation

## 2016-01-16 MED ORDER — CYCLOBENZAPRINE HCL 10 MG PO TABS: 10.0000 mg | ORAL_TABLET | Freq: Three times a day (TID) | ORAL | Status: DC | PRN

## 2016-01-16 MED ORDER — TRAMADOL HCL 50 MG PO TABS: 50.0000 mg | ORAL_TABLET | Freq: Three times a day (TID) | ORAL | Status: DC | PRN

## 2016-01-16 NOTE — Assessment & Plan Note (Signed)
New acute problem. Exam consistent with MSK pain. Treating with Tramadol, Tylenol and Flexeril.

## 2016-01-16 NOTE — Progress Notes (Signed)
Subjective:  Patient ID: Kelly [name], female    DOB: 04-08-1966  Age: 50 y.o. MRN: [id number]  CC: Upper back/neck pain, lower back pain  HPI:  50 year old female presents to the clinic today for an acute visit with the above complaints.  Back/neck pain  Patient reports a two-week history of back/neck pain.  Patient states that she has pain in the trapezius region and extending slightly downward.  She also reports lower thoracic/lower back pain.  She denies any recent fall, trauma, injury.  No changes in physical activity. No known inciting event.  Patient does have known lumbar disc disease.  She reports associated numbness and tingling of the right leg. She has had sciatica in the past.  She has been using Tylenol with no improvement.  No known exacerbating factors.  She does report that the pain was particularly severe this morning.  Social Hx   Social History   Social History  . Marital Status: Married    Spouse Name: N/A  . Number of Children: N/A  . Years of Education: N/A   Occupational History  . Part-time     Midwife   Social History Main Topics  . Smoking status: Never Smoker   . Smokeless tobacco: Never Used  . Alcohol Use: 0.0 oz/week    0 Standard drinks or equivalent per week     Comment: occasional  . Drug Use: No  . Sexual Activity: Not Currently   Other Topics Concern  . None   Social History Narrative   Review of Systems  Constitutional: Negative.   Genitourinary:       No incontinence.  Musculoskeletal: Positive for back pain and neck pain.   Objective:  BP 124/88 mmHg  Pulse 80  Temp(Src) 98.3 F (36.8 C) (Oral)  Ht 5' 7.2" (1.707 m)  Wt 196 lb 4 oz (89.018 kg)  BMI 30.55 kg/m2  SpO2 97%  BP/Weight 01/16/2016 11/06/2015 10/03/2015  Systolic BP 124 138 110  Diastolic BP 88 78 70  Wt. (Lbs) 196.25 220.8 237.4  BMI 30.55 34.57 36.96   Physical Exam  Constitutional: She appears well-developed. No distress.    Cardiovascular: Normal rate and regular rhythm.   Soft systolic murmur (2/6).  Pulmonary/Chest: Effort normal and breath sounds normal. No respiratory distress. She has no wheezes. She has no rales.  Musculoskeletal:  Trapezius muscle tenderness to palpation bilaterally. Patient with tenderness of the lower thoracic and lumbar spine. Point tender to palpation in the midline as well as the paraspinal musculature. Negative straight leg raise  Neurological: She is alert.  Psychiatric: She has a normal mood and affect.  Vitals reviewed.  Lab Results  Component Value Date   WBC 6.3 09/28/2014   HGB 13.1 09/28/2014   HCT 40.2 09/28/2014   PLT 201.0 09/28/2014   GLUCOSE 108* 06/14/2015   CHOL 193 06/14/2015   TRIG 143.0 06/14/2015   HDL 46.30 06/14/2015   LDLCALC 118* 06/14/2015   ALT 17 06/14/2015   AST 20 06/14/2015   NA 136 06/14/2015   K 3.9 06/14/2015   CL 103 06/14/2015   CREATININE 0.53 06/14/2015   BUN 11 06/14/2015   CO2 28 06/14/2015   TSH 0.81 09/28/2014   HGBA1C 5.7 06/14/2015   MICROALBUR <0.7 06/14/2015   Assessment & Plan:   Problem List Items Addressed This Visit    Back pain    New acute problem. Exam consistent with MSK pain. Treating with Tramadol, Tylenol and Flexeril.  Relevant Medications   traMADol (ULTRAM) 50 MG tablet   cyclobenzaprine (FLEXERIL) 10 MG tablet      Meds ordered this encounter  Medications  . traMADol (ULTRAM) 50 MG tablet    Sig: Take 1 tablet (50 mg total) by mouth every 8 (eight) hours as needed.    Dispense:  60 tablet    Refill:  0  . cyclobenzaprine (FLEXERIL) 10 MG tablet    Sig: Take 1 tablet (10 mg total) by mouth 3 (three) times daily as needed for muscle spasms.    Dispense:  30 tablet    Refill:  0    Follow-up: PRN  Everlene Other DO [facility]

## 2016-01-16 NOTE — Patient Instructions (Signed)
This is muscular in nature.  Take the tramadol for pain.  You can use tylenol as well.  Use the Flexeril as needed for spasm.  Heat will also help.  Follow up closely with your PCP.  Take care  Dr. Adriana Simas

## 2016-01-16 NOTE — Progress Notes (Signed)
Pre visit review using our clinic review tool, if applicable. No additional management support is needed unless otherwise documented below in the visit note. 

## 2016-03-04 DIAGNOSIS — K912 Postsurgical malabsorption, not elsewhere classified: Secondary | ICD-10-CM | POA: Insufficient documentation

## 2016-03-25 DIAGNOSIS — R1011 Right upper quadrant pain: Secondary | ICD-10-CM | POA: Insufficient documentation

## 2016-04-24 ENCOUNTER — Telehealth: Payer: Self-pay | Admitting: Internal Medicine

## 2016-04-24 NOTE — Telephone Encounter (Signed)
Pt is having problems with her allergies, and was wondering if Dr. [name] could call something in. Over the counter medicine is not working.

## 2016-04-24 NOTE — Telephone Encounter (Signed)
Spoke to patient. OTC allergy meds not working. Advised she would need to come in for an appt with Dr. [name]. Patient agreed.  Scheduled an appt.

## 2016-04-28 ENCOUNTER — Encounter: Payer: Self-pay | Admitting: Internal Medicine

## 2016-04-28 ENCOUNTER — Ambulatory Visit (INDEPENDENT_AMBULATORY_CARE_PROVIDER_SITE_OTHER): Payer: BC Managed Care – PPO | Admitting: Internal Medicine

## 2016-04-28 VITALS — BP 124/92 | HR 87 | Temp 98.3°F | Resp 12 | Ht 65.0 in | Wt 180.5 lb

## 2016-04-28 DIAGNOSIS — H60542 Acute eczematoid otitis externa, left ear: Secondary | ICD-10-CM

## 2016-04-28 DIAGNOSIS — R7301 Impaired fasting glucose: Secondary | ICD-10-CM | POA: Diagnosis not present

## 2016-04-28 DIAGNOSIS — J0110 Acute frontal sinusitis, unspecified: Secondary | ICD-10-CM | POA: Diagnosis not present

## 2016-04-28 DIAGNOSIS — H918X1 Other specified hearing loss, right ear: Secondary | ICD-10-CM | POA: Diagnosis not present

## 2016-04-28 DIAGNOSIS — H6121 Impacted cerumen, right ear: Secondary | ICD-10-CM

## 2016-04-28 DIAGNOSIS — E669 Obesity, unspecified: Secondary | ICD-10-CM

## 2016-04-28 MED ORDER — MOMETASONE FUROATE 0.1 % EX CREA: 1.0000 "application " | TOPICAL_CREAM | Freq: Every day | CUTANEOUS | Status: DC

## 2016-04-28 MED ORDER — SYRINGE (DISPOSABLE) 1 ML MISC: Status: DC

## 2016-04-28 MED ORDER — LEVOFLOXACIN 500 MG PO TABS: 500.0000 mg | ORAL_TABLET | Freq: Every day | ORAL | Status: DC

## 2016-04-28 MED ORDER — PREDNISONE 10 MG PO TABS: ORAL_TABLET | ORAL | Status: DC

## 2016-04-28 MED ORDER — CYANOCOBALAMIN 1000 MCG/ML IJ SOLN: INTRAMUSCULAR | Status: DC

## 2016-04-28 MED ORDER — DOCUSATE SODIUM 50 MG/5ML PO LIQD
ORAL | Status: DC
Start: 2016-04-28 — End: 2016-10-09

## 2016-04-28 NOTE — Progress Notes (Signed)
Pre-visit discussion using our clinic review tool. No additional management support is needed unless otherwise documented below in the visit note.  

## 2016-04-28 NOTE — Patient Instructions (Addendum)
  Congratulations on the weight loss!!  73 lbs!!!!    Liquid colace drops for the right ear  Mometasone ointment for the left ear  Prednisone taper and Levaquin (antibiotic) for the sinus infection  Continue saline flushes, zyzal , and steroid nasal spray  Please take a probiotic ( Align, Floraque or Culturelle), the generic version of one of these over the counter medications, or an alternative form (kombucha,  Yogurt, or another dietary source) for a minimum of 3 weeks to prevent a serious antibiotic associated diarrhea  Called clostridium dificile colitis.  Taking a probiotic may also prevent vaginitis due to yeast infections and can be continued indefinitely if you feel that it improves your digestion or your elimination (bowels).

## 2016-04-28 NOTE — Progress Notes (Signed)
Subjective:  Patient ID: Kelly [name], female    DOB: [birth year]  Age: 50 y.o. MRN: [id number]  CC: The primary encounter diagnosis was Hearing loss due to cerumen impaction, right. Diagnoses of Acute non-recurrent frontal sinusitis, Obesity (BMI 30-39.9), Impaired fasting glucose, and Otitis externa, eczematoid, left were also pertinent to this visit.  HPI Kelly [name] presents for persistent sinus congestion , frontal headache and otitis.  symptoms havestarted several weeks ago with sneezing and rhinitis  for several weeks which she treated as allergic rhinitis with Xyzal and a steroid nasal spray.  She added tylenol cold n sinus 4 times daily  When the congestion became persistent.  She has been flushing sinuses daily with simply  Saline.  Her nasal drainage is mostly clear,  But she has a left frontal headache and tenderness over the left forehead.  Stilll of sneezing despite using Xyzal daily and steroid nasal spray  Left ear is itching,  No drainage or pain.  Hearing has  been diminished from the right ear.   Obeisty:  Patient underwent bariatric surgery after her last visit in October without complications and has lost 73 l bs thus far. Her personal goal is 160 lbs,  But her bariatric  Clinic has given her a goal weight of  140 lbs. She has been walking for exercise .  Reports decreased appetite,  Forgets to eat.  Taking her MVi.  And B12 nasal spray , requesting a refill.  Willing to use B12 injections.  instructiosn given today by Olegario MessierKathy.    Outpatient Prescriptions Prior to Visit  Medication Sig Dispense Refill  . AMITIZA 8 MCG capsule TAKE 1 CAPSULE BY MOUTH TWICE DAILY WITH A MEAL. (Patient not taking: Reported on 04/28/2016) 60 capsule 0  . hydrocortisone (ANUSOL-HC) 2.5 % rectal cream Place 1 application rectally 2 (two) times daily. (Patient not taking: Reported on 04/28/2016) 30 g 0  . losartan (COZAAR) 50 MG tablet TAKE 1 TABLET(50 MG) BY MOUTH DAILY (Patient not taking:  Reported on 04/28/2016) 90 tablet 0  . cyclobenzaprine (FLEXERIL) 10 MG tablet Take 1 tablet (10 mg total) by mouth 3 (three) times daily as needed for muscle spasms. (Patient not taking: Reported on 04/28/2016) 30 tablet 0  . gabapentin (NEURONTIN) 100 MG capsule Take 1 capsule (100 mg total) by mouth 3 (three) times daily. (Patient not taking: Reported on 04/28/2016) 90 capsule 3  . mometasone (NASONEX) 50 MCG/ACT nasal spray Place 2 sprays into the nose daily. (Patient not taking: Reported on 04/28/2016) 17 g 12  . pantoprazole (PROTONIX) 40 MG tablet Take 1 tablet (40 mg total) by mouth daily. (Patient not taking: Reported on 04/28/2016) 90 tablet 1  . traMADol (ULTRAM) 50 MG tablet Take 1 tablet (50 mg total) by mouth every 8 (eight) hours as needed. (Patient not taking: Reported on 04/28/2016) 60 tablet 0   No facility-administered medications prior to visit.    Review of Systems;  Patient denies , fevers, malaise, unintentional weight loss, skin rash, eye pain,  sore throat, dysphagia,  hemoptysis , cough, dyspnea, wheezing, chest pain, palpitations, orthopnea, edema, abdominal pain, nausea, melena, diarrhea, constipation, flank pain, dysuria, hematuria, urinary  Frequency, nocturia, numbness, tingling, seizures,  Focal weakness, Loss of consciousness,  Tremor, insomnia, depression, anxiety, and suicidal ideation.      Objective:  BP 124/92 mmHg  Pulse 87  Temp(Src) 98.3 F (36.8 C) (Oral)  Resp 12  Ht 5\' 5"  (1.651 m)  Wt 180 lb 8 oz (  81.874 kg)  BMI 30.04 kg/m2  SpO2 95%  BP Readings from Last 3 Encounters:  04/28/16 124/92  01/16/16 124/88  11/06/15 138/78    Wt Readings from Last 3 Encounters:  04/28/16 180 lb 8 oz (81.874 kg)  01/16/16 196 lb 4 oz (89.018 kg)  11/06/15 220 lb 12.8 oz (100.154 kg)    General appearance: alert, cooperative and appears stated age Face: frontal sinus tender on the left.  Normal maxillaru and right frontal sinus  Ears: right sided cerumen impaction  occluding the canal ,  Left canal clear Throat: lips, mucosa, and tongue normal; teeth and gums normal Neck: no adenopathy, no carotid bruit, supple, symmetrical, trachea midline and thyroid not enlarged, symmetric, no tenderness/mass/nodules Back: symmetric, no curvature. ROM normal. No CVA tenderness. Lungs: clear to auscultation bilaterally Heart: regular rate and rhythm, S1, S2 normal, no murmur, click, rub or gallop Abdomen: soft, non-tender; bowel sounds normal; no masses,  no organomegaly Pulses: 2+ and symmetric Skin: Skin color, texture, turgor normal. No rashes or lesions Lymph nodes: Cervical, supraclavicular, and axillary nodes normal.  Lab Results  Component Value Date   HGBA1C 5.7 06/14/2015   HGBA1C 5.8 09/13/2012    Lab Results  Component Value Date   CREATININE 0.53 06/14/2015   CREATININE 0.6 09/28/2014   CREATININE 0.6 03/14/2013    Lab Results  Component Value Date   WBC 6.3 09/28/2014   HGB 13.1 09/28/2014   HCT 40.2 09/28/2014   PLT 201.0 09/28/2014   GLUCOSE 108* 06/14/2015   CHOL 193 06/14/2015   TRIG 143.0 06/14/2015   HDL 46.30 06/14/2015   LDLCALC 118* 06/14/2015   ALT 17 06/14/2015   AST 20 06/14/2015   NA 136 06/14/2015   K 3.9 06/14/2015   CL 103 06/14/2015   CREATININE 0.53 06/14/2015   BUN 11 06/14/2015   CO2 28 06/14/2015   TSH 0.81 09/28/2014   HGBA1C 5.7 06/14/2015   MICROALBUR <0.7 06/14/2015    Ct Head Limited W/o Cm  02/12/2015   PRIOR REPORT IMPORTED FROM AN EXTERNAL SYSTEM  CLINICAL DATA:  Headache for 3 days.  No history of trauma. EXAM: CT HEAD WITHOUT CONTRAST TECHNIQUE: Contiguous axial images were obtained from the base of the skull through the vertex without intravenous contrast. COMPARISON:  CT scan of February 14, 2007. FINDINGS: Bony calvarium appears intact. No mass effect or midline shift is noted. Ventricular size is within normal limits. There is no evidence of mass lesion, hemorrhage or acute infarction.  IMPRESSION: Normal head CT. Electronically Signed   By: Lupita Raider, M.D.   On: 02/12/2015 12:50     Assessment & Plan:   Problem List Items Addressed This Visit    Obesity (BMI 30-39.9)    Body mass index is 30.04 kg/(m^2). She has lost 73 lbs since undergoing Rou en Y procedure in late Oct 2016.  Advised her to increase her exercise to 5 times daily , 30 minutes,  And continue to eat 6 smaller meals daily to avoid stretching her stomach Patient personal goal is 160 lbs.  [facility] goal is 1540 lbs       Impaired fasting glucose    Review of recent labs indicate a drop in a1c from 5.8 to 5.4        Acute frontal sinusitis    Left sided,  secondary to prolonged congestion. prednisone taper,  Levaquin ,  continue antihistamines and flushing ,  Probiotic advised.  Relevant Medications   pseudoephedrine-acetaminophen (TYLENOL SINUS) 30-500 MG TABS tablet   predniSONE (DELTASONE) 10 MG tablet   levofloxacin (LEVAQUIN) 500 MG tablet   Hearing loss due to cerumen impaction - Primary    Right ear .  Liquid colace or debrox advised,  Return for flushing       Otitis externa, eczematoid    Mometasone ointment prescribed.          I have discontinued Ms. Ellender's mometasone, gabapentin, pantoprazole, traMADol, and cyclobenzaprine. I am also having her start on predniSONE, docusate, levofloxacin, mometasone, cyanocobalamin, and Syringe (Disposable). Additionally, I am having her maintain her hydrocortisone, AMITIZA, losartan, and pseudoephedrine-acetaminophen.  Meds ordered this encounter  Medications  . pseudoephedrine-acetaminophen (TYLENOL SINUS) 30-500 MG TABS tablet    Sig: Take 2 tablets by mouth every 4 (four) hours as needed.  . predniSONE (DELTASONE) 10 MG tablet    Sig: 6 tablets on Day 1 , then reduce by 1 tablet daily until gone    Dispense:  21 tablet    Refill:  0  . docusate (COLACE) 50 MG/5ML liquid    Sig: 3-4 drops in right ear nightly to soften  earwax    Dispense:  120 mL    Refill:  0  . levofloxacin (LEVAQUIN) 500 MG tablet    Sig: Take 1 tablet (500 mg total) by mouth daily.    Dispense:  7 tablet    Refill:  0  . mometasone (ELOCON) 0.1 % cream    Sig: Apply 1 application topically daily. To left ear canal    Dispense:  15 g    Refill:  0  . cyanocobalamin (,VITAMIN B-12,) 1000 MCG/ML injection    Sig: For use with monthly B12 injections    Dispense:  10 mL    Refill:  2  . Syringe, Disposable, 1 ML MISC    Sig: FOR USE WITH b12 INJECTIBLE SOLUTION    Dispense:  25 each    Refill:  0    Medications Discontinued During This Encounter  Medication Reason  . traMADol (ULTRAM) 50 MG tablet   . cyclobenzaprine (FLEXERIL) 10 MG tablet   . gabapentin (NEURONTIN) 100 MG capsule   . mometasone (NASONEX) 50 MCG/ACT nasal spray   . pantoprazole (PROTONIX) 40 MG tablet     Follow-up: No Follow-up on file.   [name], MD

## 2016-04-29 DIAGNOSIS — H60549 Acute eczematoid otitis externa, unspecified ear: Secondary | ICD-10-CM | POA: Insufficient documentation

## 2016-04-29 DIAGNOSIS — J0110 Acute frontal sinusitis, unspecified: Secondary | ICD-10-CM | POA: Insufficient documentation

## 2016-04-29 DIAGNOSIS — H6120 Impacted cerumen, unspecified ear: Secondary | ICD-10-CM | POA: Insufficient documentation

## 2016-04-29 NOTE — Assessment & Plan Note (Signed)
Body mass index is 30.04 kg/(m^2). She has lost 73 lbs since undergoing Rou en Y procedure in late Oct 2016.  Advised her to increase her exercise to 5 times daily , 30 minutes,  And continue to eat 6 smaller meals daily to avoid stretching her stomach Patient personal goal is 160 lbs.  [facility] goal is 1540 lbs

## 2016-04-29 NOTE — Assessment & Plan Note (Signed)
Left sided,  secondary to prolonged congestion. prednisone taper,  Levaquin ,  continue antihistamines and flushing ,  Probiotic advised.

## 2016-04-29 NOTE — Assessment & Plan Note (Addendum)
Right ear .  Liquid colace or debrox advised,  Return for flushing

## 2016-04-29 NOTE — Assessment & Plan Note (Signed)
Review of recent labs indicate a drop in a1c from 5.8 to 5.4

## 2016-04-29 NOTE — Assessment & Plan Note (Signed)
Mometasone ointment prescribed  

## 2016-08-11 ENCOUNTER — Other Ambulatory Visit: Payer: Self-pay | Admitting: Obstetrics and Gynecology

## 2016-08-11 ENCOUNTER — Ambulatory Visit
Admission: RE | Admit: 2016-08-11 | Discharge: 2016-08-11 | Disposition: A | Discharge status: Home / Self Care | Payer: BC Managed Care – PPO | Source: Ambulatory Visit | Attending: Obstetrics and Gynecology | Admitting: Obstetrics and Gynecology

## 2016-08-11 DIAGNOSIS — Z1231 Encounter for screening mammogram for malignant neoplasm of breast: Secondary | ICD-10-CM | POA: Insufficient documentation

## 2016-09-26 ENCOUNTER — Telehealth: Payer: Self-pay | Admitting: *Deleted

## 2016-09-26 NOTE — Telephone Encounter (Signed)
Patient  Needs to be seen before receiving it due to it being an SSRI. Patient may have taken it in the past but since the time in between has been over a year she will need to start the whole processe over, being evaluated on the sx of medication.

## 2016-09-26 NOTE — Telephone Encounter (Signed)
Scheduled

## 2016-09-26 NOTE — Telephone Encounter (Signed)
Pt requested a medication refill for lexapro, pt stated that she has not had this medication for over a year, however she need this medication now for her nerves. Pt contact [phone] [facility] [facility] [place]

## 2016-10-04 ENCOUNTER — Emergency Department
Admission: EM | Admit: 2016-10-04 | Discharge: 2016-10-04 | Disposition: A | Discharge status: Home / Self Care | Payer: BC Managed Care – PPO | Attending: Emergency Medicine | Admitting: Emergency Medicine

## 2016-10-04 ENCOUNTER — Encounter: Payer: Self-pay | Admitting: Emergency Medicine

## 2016-10-04 ENCOUNTER — Emergency Department: Payer: BC Managed Care – PPO

## 2016-10-04 DIAGNOSIS — Z7952 Long term (current) use of systemic steroids: Secondary | ICD-10-CM | POA: Diagnosis not present

## 2016-10-04 DIAGNOSIS — Z792 Long term (current) use of antibiotics: Secondary | ICD-10-CM | POA: Insufficient documentation

## 2016-10-04 DIAGNOSIS — Z91040 Latex allergy status: Secondary | ICD-10-CM | POA: Insufficient documentation

## 2016-10-04 DIAGNOSIS — Z791 Long term (current) use of non-steroidal anti-inflammatories (NSAID): Secondary | ICD-10-CM | POA: Insufficient documentation

## 2016-10-04 DIAGNOSIS — I10 Essential (primary) hypertension: Secondary | ICD-10-CM | POA: Diagnosis not present

## 2016-10-04 DIAGNOSIS — Z79899 Other long term (current) drug therapy: Secondary | ICD-10-CM | POA: Diagnosis not present

## 2016-10-04 DIAGNOSIS — R109 Unspecified abdominal pain: Secondary | ICD-10-CM

## 2016-10-04 DIAGNOSIS — R110 Nausea: Secondary | ICD-10-CM | POA: Insufficient documentation

## 2016-10-04 LAB — CBC
HEMATOCRIT: 38.9 % (ref 35.0–47.0)
HEMOGLOBIN: 13.4 g/dL (ref 12.0–16.0)
MCH: 30 pg (ref 26.0–34.0)
MCHC: 34.5 g/dL (ref 32.0–36.0)
MCV: 86.9 fL (ref 80.0–100.0)
Platelets: 136 10*3/uL — ABNORMAL LOW (ref 150–440)
RBC: 4.47 MIL/uL (ref 3.80–5.20)
RDW: 13.6 % (ref 11.5–14.5)
WBC: 4.8 10*3/uL (ref 3.6–11.0)

## 2016-10-04 LAB — BASIC METABOLIC PANEL
ANION GAP: 7 (ref 5–15)
BUN: 11 mg/dL (ref 6–20)
CHLORIDE: 108 mmol/L (ref 101–111)
CO2: 24 mmol/L (ref 22–32)
Calcium: 9 mg/dL (ref 8.9–10.3)
Creatinine, Ser: 0.54 mg/dL (ref 0.44–1.00)
GFR calc Af Amer: >60 mL/min (ref >60)
GLUCOSE: 100 mg/dL — AB (ref 65–99)
POTASSIUM: 3.9 mmol/L (ref 3.5–5.1)
Sodium: 139 mmol/L (ref 135–145)

## 2016-10-04 LAB — HEPATIC FUNCTION PANEL
ALBUMIN: 4 g/dL (ref 3.5–5.0)
ALK PHOS: 57 U/L (ref 38–126)
ALT: 18 U/L (ref 14–54)
AST: 20 U/L (ref 15–41)
BILIRUBIN TOTAL: 0.7 mg/dL (ref 0.3–1.2)
Total Protein: 7.1 g/dL (ref 6.5–8.1)

## 2016-10-04 LAB — URINALYSIS COMPLETE WITH MICROSCOPIC (ARMC ONLY)
Bilirubin Urine: NEGATIVE
Glucose, UA: NEGATIVE mg/dL
Hgb urine dipstick: NEGATIVE
Ketones, ur: NEGATIVE mg/dL
Leukocytes, UA: NEGATIVE
Nitrite: POSITIVE — AB
Protein, ur: 30 mg/dL — AB
Specific Gravity, Urine: 1.023 (ref 1.005–1.030)
pH: 7 (ref 5.0–8.0)

## 2016-10-04 LAB — LIPASE, BLOOD: Lipase: 30 U/L (ref 11–51)

## 2016-10-04 MED ORDER — MORPHINE SULFATE (PF) 4 MG/ML IV SOLN
4.0000 mg | Freq: Once | INTRAVENOUS | Status: AC
  Administered 2016-10-04: 4 mg via INTRAVENOUS
  Filled 2016-10-04: qty 1

## 2016-10-04 MED ORDER — SODIUM CHLORIDE 0.9 % IV BOLUS (SEPSIS)
1000.0000 mL | Freq: Once | INTRAVENOUS | Status: AC
  Administered 2016-10-04: 1000 mL via INTRAVENOUS

## 2016-10-04 MED ORDER — HYDROCODONE-ACETAMINOPHEN 5-325 MG PO TABS: 1.0000 | ORAL_TABLET | Freq: Four times a day (QID) | ORAL | 0 refills | Status: DC | PRN

## 2016-10-04 MED ORDER — ONDANSETRON HCL 4 MG/2ML IJ SOLN
4.0000 mg | Freq: Once | INTRAMUSCULAR | Status: AC
  Administered 2016-10-04: 4 mg via INTRAVENOUS
  Filled 2016-10-04: qty 2

## 2016-10-04 MED ORDER — MORPHINE SULFATE (PF) 2 MG/ML IV SOLN
2.0000 mg | Freq: Once | INTRAVENOUS | Status: AC
  Administered 2016-10-04: 2 mg via INTRAVENOUS
  Filled 2016-10-04: qty 1

## 2016-10-04 MED ORDER — ONDANSETRON 4 MG PO TBDP: 4.0000 mg | ORAL_TABLET | Freq: Four times a day (QID) | ORAL | 0 refills | Status: DC | PRN

## 2016-10-04 MED ORDER — CEPHALEXIN 500 MG PO CAPS: 500.0000 mg | ORAL_CAPSULE | Freq: Four times a day (QID) | ORAL | 0 refills | Status: DC

## 2016-10-04 NOTE — ED Notes (Signed)
Pt states understanding of discharge instructions. NAD noted at this time.  

## 2016-10-04 NOTE — ED Triage Notes (Signed)
[facility] with left flank pain x 5 days.  Patient states pain was intermittent but is now more constant.  Patient states pain tends to be worse when she moves and worse with urination.  Patient reports history of a kidney stone 20 years ago and states this feels similar.  Patient is in no obvious distress during triage.

## 2016-10-04 NOTE — ED Notes (Signed)
x2 unsuccessful IV attempts

## 2016-10-04 NOTE — ED Provider Notes (Signed)
[name] Provider Note   ____________________________________________   First MD Initiated Contact with Patient 10/04/16 0940     (approximate)  I have reviewed the triage vital signs and the nursing notes.   HISTORY  Chief Complaint Flank Pain    HPI Kelly [name] is a 50 y.o. female history of previous gastric bypass [name] 1 year ago, also distant history of kidney stones and a prior hysterectomy  Patient reports since about Wednesday she had been experiencing what felt like a pulled muscle along the left mid back and flank. Throughout the last day it is worsened, becoming severe and very uncomfortable in intensity. Yesterday no small amount of blood in her urine. Reports a sharp stabbing pain in the left flank area no vomiting but mild nausea.  No chest pain or shortness of breath. No fevers or chills. No pain or burning with urination but did notice blood in the urine.   Past Medical History:  Diagnosis Date  . Anemia    severe due to menorrhagia, s/p hysterectomy  . Chest pain 2002   abnormal EKG, negative treadmill stress test/Holter myoview, Dr. Welton Flakes  . Obesity   . Obstructive sleep apnea 2009   mild, wears CPAP  . Preeclampsia    delivered twins at 35 weeks    Patient Active Problem List   Diagnosis Date Noted  . Acute frontal sinusitis 04/29/2016  . Hearing loss due to cerumen impaction 04/29/2016  . Otitis externa, eczematoid 04/29/2016  . Back pain 01/16/2016  . Impaired fasting glucose 06/18/2015  . GERD (gastroesophageal reflux disease) 06/17/2015  . Essential hypertension 10/21/2014  . Tremor of both hands 10/21/2014  . Visit for preventive health examination 09/19/2014  . S/P hysterectomy with oophorectomy 09/19/2014  . Generalized anxiety disorder 09/18/2014  . Snoring 09/18/2014  . Irritable bowel syndrome 11/11/2013  . Other and unspecified hyperlipidemia 03/14/2013  . 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 Tremor of unknown origin 09/13/2012  . Major depressive disorder, recurrent episode with anxious distress (HCC) 07/27/2012  . Migraine headache without aura 07/27/2012  . Sciatica of left side 01/08/2012  . Obesity (BMI 30-39.9) 12/31/2011    Past Surgical History:  Procedure Laterality Date  . ABDOMINAL HYSTERECTOMY  35   secondary to post partum hemorrhage causing anemia  . GASTRIC BYPASS  2016  . SPINE [name]     lumbar spine    Prior to Admission medications   Medication Sig Start Date End Date Taking? Authorizing Provider  AMITIZA 8 MCG capsule TAKE 1 CAPSULE BY MOUTH TWICE DAILY WITH A MEAL. Patient not taking: Reported on 04/28/2016 09/06/15   [name], MD  cephALEXin (KEFLEX) 500 MG capsule Take 1 capsule (500 mg total) by mouth 4 (four) times daily. 10/04/16   [name], MD  cyanocobalamin (,VITAMIN B-12,) 1000 MCG/ML injection For use with monthly B12 injections 04/28/16   [name], MD  docusate (COLACE) 50 MG/5ML liquid 3-4 drops in right ear nightly to soften earwax 04/28/16   [name], MD  HYDROcodone-acetaminophen (NORCO/VICODIN) 5-325 MG tablet Take 1-2 tablets by mouth every 6 (six) hours as needed for moderate pain. 10/04/16   [name], MD  hydrocortisone (ANUSOL-HC) 2.5 % rectal cream Place 1 application rectally 2 (two) times daily. Patient not taking: Reported on 04/28/2016 11/11/13   [name], MD  levofloxacin (LEVAQUIN) 500 MG tablet Take 1 tablet (500 mg total) by mouth daily. 04/28/16   [name]  [name], MD  losartan (COZAAR) 50 MG tablet TAKE 1 TABLET(50 MG) BY MOUTH DAILY Patient not taking: Reported on 04/28/2016 11/06/15   [name], NP  mometasone (ELOCON) 0.1 % cream Apply 1 application topically daily. To left ear canal 04/28/16   Sherlene Shamseresa L [name], MD  ondansetron (ZOFRAN ODT) 4 MG disintegrating tablet Take 1 tablet (4 mg total) by mouth every 6 (six) hours as needed for nausea or vomiting. 10/04/16   Sharyn CreamerMark Nancye Grumbine, MD    predniSONE (DELTASONE) 10 MG tablet 6 tablets on Day 1 , then reduce by 1 tablet daily until gone 04/28/16   Sherlene Shamseresa L [name], MD  pseudoephedrine-acetaminophen (TYLENOL SINUS) 30-500 MG TABS tablet Take 2 tablets by mouth every 4 (four) hours as needed.    Historical Provider, MD  Syringe, Disposable, 1 ML MISC FOR USE WITH b12 INJECTIBLE SOLUTION 04/28/16   Sherlene Shamseresa L [name], MD    Allergies Latex and Codeine  Family History  Problem Relation Age of Onset  . Hyperlipidemia Mother   . Arthritis Mother   . Hypertension Mother   . Hypertension Father   . Hypertension Brother   . Diabetes Brother   . Hyperlipidemia Brother   . Heart disease Maternal Uncle   . Coronary artery disease Maternal Uncle   . Diabetes Paternal Aunt   . Coronary artery disease Maternal Grandfather   . Breast cancer Maternal Aunt [id number]    Social History Social History  Substance Use Topics  . Smoking status: Never Smoker  . Smokeless tobacco: Never Used  . Alcohol use 0.0 oz/week     Comment: occasional    Review of Systems Constitutional: No fever/chills Eyes: No visual changes. ENT: No sore throat. Cardiovascular: Denies chest pain. Respiratory: Denies shortness of breath. Gastrointestinal: No abdominal painExcept for along the left flank.  No vomiting.  No diarrhea.  No constipation. Genitourinary: Negative for dysuria. Musculoskeletal: Negative for back pain except along the left lower. Skin: Negative for rash. Neurological: Negative for headaches, focal weakness or numbness.  10-point ROS otherwise negative.  ____________________________________________   PHYSICAL EXAM:  VITAL SIGNS: ED Triage Vitals  Enc Vitals Group     BP 10/04/16 0919 132/87     Pulse Rate 10/04/16 0919 77     Resp 10/04/16 0919 16     Temp 10/04/16 0919 97.5 F (36.4 C)     Temp Source 10/04/16 0919 Oral     SpO2 10/04/16 0919 100 %     Weight 10/04/16 0920 163 lb (73.9 kg)     Height 10/04/16 0920 5\' 5"  (1.651 m)      Head Circumference --      Peak Flow --      Pain Score 10/04/16 0920 9     Pain Loc --      Pain Edu? --      Excl. in GC? --     Constitutional: Alert and oriented. Well appearing And very pleasant but appears in moderate discomfort holding her hand over her left flank seemingly in uncomfortable with Eyes: Conjunctivae are normal. PERRL. EOMI. Head: Atraumatic. Nose: No congestion/rhinnorhea. Mouth/Throat: Mucous membranes are moist.  Oropharynx non-erythematous. Neck: No stridor.   Cardiovascular: Normal rate, regular rhythm. Grossly normal heart sounds.  Good peripheral circulation. Respiratory: Normal respiratory effort.  No retractions. Lungs CTAB. Gastrointestinal: Soft and nontender except for moderate discomfort in the left flank. No distention. No abdominal bruits. Severe left-sided CVA tenderness, none on the right Musculoskeletal: No lower extremity tenderness nor  edema.  No joint effusions. Neurologic:  Normal speech and language. No gross focal neurologic deficits are appreciated. Skin:  Skin is warm, dry and intact. No rash noted. Psychiatric: Mood and affect are normal. Speech and behavior are normal.  ____________________________________________   LABS (all labs ordered are listed, but only abnormal results are displayed)  Labs Reviewed  URINALYSIS COMPLETEWITH MICROSCOPIC ([facility] ONLY) - Abnormal; Notable for the following:       Result Value   Color, Urine YELLOW (*)    APPearance CLEAR (*)    Protein, ur 30 (*)    Nitrite POSITIVE (*)    Bacteria, UA FEW (*)    Squamous Epithelial / LPF 0-5 (*)    All other components within normal limits  BASIC METABOLIC PANEL - Abnormal; Notable for the following:    Glucose, Bld 100 (*)    All other components within normal limits  CBC - Abnormal; Notable for the following:    Platelets 136 (*)    All other components within normal limits  HEPATIC FUNCTION PANEL - Abnormal; Notable for the following:    Bilirubin,  Direct <0.1 (*)    All other components within normal limits  LIPASE, BLOOD   ____________________________________________  EKG   ____________________________________________  RADIOLOGY  Ct Renal Stone Study  Result Date: 10/04/2016 CLINICAL DATA:  Left-sided flank pain for 5 days. EXAM: CT ABDOMEN AND PELVIS WITHOUT CONTRAST TECHNIQUE: Multidetector CT imaging of the abdomen and pelvis was performed following the standard protocol without IV contrast. COMPARISON:  02/19/2009. FINDINGS: Lower chest: Lung bases show no acute findings. Heart size normal. No pericardial or pleural effusion. Hepatobiliary: Liver and gallbladder are unremarkable. No biliary ductal dilatation. Pancreas: Negative. Spleen: Negative. Adrenals/Urinary Tract: Adrenal glands and kidneys are unremarkable. Ureters are decompressed. Bladder is grossly unremarkable. Stomach/Bowel: Postoperative changes of gastric bypass. Stomach, small bowel, appendix and colon are otherwise unremarkable. Vascular/Lymphatic: Vascular structures are unremarkable. No pathologically enlarged lymph nodes. Reproductive: Hysterectomy.  No adnexal mass. Other: No free fluid.  Mesenteries and peritoneum are unremarkable. Musculoskeletal: No worrisome lytic or sclerotic lesions. Degenerative changes are seen in the spine. IMPRESSION: No findings to explain the patient's pain. No urinary stones or obstruction. Electronically Signed   By: Leanna Battles M.D.   On: 10/04/2016 10:51    ____________________________________________   PROCEDURES  Procedure(s) performed: None  Procedures  Critical Care performed: No  ____________________________________________   INITIAL IMPRESSION / ASSESSMENT AND PLAN / ED COURSE  Pertinent labs & imaging results that were available during my care of the patient were reviewed by me and considered in my medical decision making (see chart for details).  Differential diagnosis includes but is not limited to,  abdominal perforation, aortic dissection, cholecystitis, appendicitis, diverticulitis, colitis, esophagitis/gastritis, kidney stone, pyelonephritis, urinary tract infection, aortic aneurysm. All are considered in decision and treatment plan. Based upon the patient's presentation and risk factors, the patient's report of hematuria yesterday the location of left flank pain and left CVA tenderness and will suspect a possible kidney stone. No leukocytosis or fever to suggest an obvious infectious etiology and her abdominal exam itself demonstrates no abdominal tenderness except along the left flank without evidence of peritonitis.  ----------------------------------------- 12:56 PM on 10/04/2016 -----------------------------------------  The patient reports that she is much more comfortable now. She is resting comfortably with her mother. In no distress, and reviewed carefully her CAT scan and also reviewed her laboratory work and that I am suspicious that are not certain she may have a  small urinary tract infection as there are bacteria seen in the urine, and also some nitrates. At alternative seems reasonable would be that she could have a small kidney stone and she saw small amount of hematuria yesterday, but this seems to have resolved and given her location of flank pain. She doesn't seem to have any risk factors (high risk for an ischemic injury to the kidney or spleen or other vascular abnormality. She is neurovascular intact distally. Discussed with the patient, her husband will be driving her home and she will be following up closely with her doctor. Return precautions advised  I will prescribe the patient a narcotic pain medicine due to their condition which I anticipate will cause at least moderate pain short term. I discussed with the patient safe use of narcotic pain medicines, and that they are not to drive, work in dangerous areas, or ever take more than prescribed (no more than 1 pill every 6  hours). We discussed that this is the type of medication that can be  overdosed on and the risks of this type of medicine. Patient is very agreeable to only use as prescribed and to never use more than prescribed.    Clinical Course     ____________________________________________   FINAL CLINICAL IMPRESSION(S) / ED DIAGNOSES  Final diagnoses:  Acute left flank pain      NEW MEDICATIONS STARTED DURING THIS VISIT:  New Prescriptions   CEPHALEXIN (KEFLEX) 500 MG CAPSULE    Take 1 capsule (500 mg total) by mouth 4 (four) times daily.   HYDROCODONE-ACETAMINOPHEN (NORCO/VICODIN) 5-325 MG TABLET    Take 1-2 tablets by mouth every 6 (six) hours as needed for moderate pain.   ONDANSETRON (ZOFRAN ODT) 4 MG DISINTEGRATING TABLET    Take 1 tablet (4 mg total) by mouth every 6 (six) hours as needed for nausea or vomiting.     Note:  This document was prepared using Dragon voice recognition software and may include unintentional dictation errors.     [name], MD 10/04/16 1257

## 2016-10-04 NOTE — Discharge Instructions (Signed)
You were seen in the emergency room for abdominal pain. It is important that you follow up closely with your primary care doctor in the next couple of days. Name suspicious that she may have had a small kidney stone as he saw blood in urine yesterday, and weight her pain is located today however he also have some small amounts of bacteria in her urine and I will we'll treat you with urinary tract infection medication as well.  If you're unable to see her primary care doctor you may return to the emergency room or go to the [facility] in 1 or 2 days for reexam.  Please return to the emergency room right away if you are to develop a fever, severe nausea, your pain becomes severe or worsens, you are unable to keep food down, begin vomiting any dark or bloody fluid, you develop any dark or bloody stools, feel dehydrated, or other new concerns or symptoms arise.

## 2016-10-09 ENCOUNTER — Emergency Department: Payer: BC Managed Care – PPO

## 2016-10-09 ENCOUNTER — Emergency Department
Admission: EM | Admit: 2016-10-09 | Discharge: 2016-10-09 | Disposition: A | Discharge status: Home / Self Care | Payer: BC Managed Care – PPO | Attending: Emergency Medicine | Admitting: Emergency Medicine

## 2016-10-09 ENCOUNTER — Encounter: Payer: Self-pay | Admitting: Emergency Medicine

## 2016-10-09 DIAGNOSIS — Z79899 Other long term (current) drug therapy: Secondary | ICD-10-CM | POA: Insufficient documentation

## 2016-10-09 DIAGNOSIS — R1030 Lower abdominal pain, unspecified: Secondary | ICD-10-CM | POA: Insufficient documentation

## 2016-10-09 DIAGNOSIS — R0789 Other chest pain: Secondary | ICD-10-CM | POA: Diagnosis not present

## 2016-10-09 DIAGNOSIS — I10 Essential (primary) hypertension: Secondary | ICD-10-CM | POA: Insufficient documentation

## 2016-10-09 DIAGNOSIS — R102 Pelvic and perineal pain: Secondary | ICD-10-CM

## 2016-10-09 DIAGNOSIS — R109 Unspecified abdominal pain: Secondary | ICD-10-CM

## 2016-10-09 LAB — CBC
HEMATOCRIT: 38.7 % (ref 35.0–47.0)
HEMOGLOBIN: 13.2 g/dL (ref 12.0–16.0)
MCH: 30.2 pg (ref 26.0–34.0)
MCHC: 34.1 g/dL (ref 32.0–36.0)
MCV: 88.5 fL (ref 80.0–100.0)
Platelets: 158 10*3/uL (ref 150–440)
RBC: 4.38 MIL/uL (ref 3.80–5.20)
RDW: 13.5 % (ref 11.5–14.5)
WBC: 5.6 10*3/uL (ref 3.6–11.0)

## 2016-10-09 LAB — URINALYSIS COMPLETE WITH MICROSCOPIC (ARMC ONLY)
BILIRUBIN URINE: NEGATIVE
GLUCOSE, UA: NEGATIVE mg/dL
HGB URINE DIPSTICK: NEGATIVE
Ketones, ur: NEGATIVE mg/dL
LEUKOCYTES UA: NEGATIVE
Nitrite: NEGATIVE
PH: 6 (ref 5.0–8.0)
Protein, ur: NEGATIVE mg/dL
SPECIFIC GRAVITY, URINE: 1.021 (ref 1.005–1.030)

## 2016-10-09 LAB — BASIC METABOLIC PANEL
ANION GAP: 5 (ref 5–15)
BUN: 8 mg/dL (ref 6–20)
CHLORIDE: 107 mmol/L (ref 101–111)
CO2: 27 mmol/L (ref 22–32)
Calcium: 8.9 mg/dL (ref 8.9–10.3)
Creatinine, Ser: 0.65 mg/dL (ref 0.44–1.00)
GFR calc Af Amer: >60 mL/min (ref >60)
GLUCOSE: 110 mg/dL — AB (ref 65–99)
POTASSIUM: 3.5 mmol/L (ref 3.5–5.1)
Sodium: 139 mmol/L (ref 135–145)

## 2016-10-09 LAB — TROPONIN I: Troponin I: <0.03 ng/mL (ref <0.03)

## 2016-10-09 LAB — ACETAMINOPHEN LEVEL

## 2016-10-09 MED ORDER — PROMETHAZINE HCL 25 MG/ML IJ SOLN
25.0000 mg | Freq: Once | INTRAMUSCULAR | Status: AC
  Administered 2016-10-09: 25 mg via INTRAVENOUS
  Filled 2016-10-09: qty 1

## 2016-10-09 MED ORDER — PROMETHAZINE HCL 12.5 MG PO TABS: 12.5000 mg | ORAL_TABLET | Freq: Four times a day (QID) | ORAL | 0 refills | Status: DC | PRN

## 2016-10-09 MED ORDER — KETOROLAC TROMETHAMINE 30 MG/ML IJ SOLN
30.0000 mg | Freq: Once | INTRAMUSCULAR | Status: AC
  Administered 2016-10-09: 30 mg via INTRAVENOUS
  Filled 2016-10-09: qty 1

## 2016-10-09 MED ORDER — PROMETHAZINE HCL 25 MG RE SUPP: 25.0000 mg | Freq: Four times a day (QID) | RECTAL | 0 refills | Status: DC | PRN

## 2016-10-09 MED ORDER — SODIUM CHLORIDE 0.9 % IV BOLUS (SEPSIS)
1000.0000 mL | Freq: Once | INTRAVENOUS | Status: AC
  Administered 2016-10-09: 1000 mL via INTRAVENOUS

## 2016-10-09 MED ORDER — IOPAMIDOL (ISOVUE-370) INJECTION 76%
75.0000 mL | Freq: Once | INTRAVENOUS | Status: AC | PRN
  Administered 2016-10-09: 75 mL via INTRAVENOUS

## 2016-10-09 NOTE — Discharge Instructions (Addendum)
Pleased and plenty of fluids stay well-hydrated.  Complete your course of Keflex.  Return to the emergency department if you develop severe pain, fever, inability to keep down fluids, lightheadedness or fainting, or any other symptoms concerning to you.

## 2016-10-09 NOTE — ED Triage Notes (Signed)
Says continues to have left flank pain.  Also left chest pain since this am. Sob x 2 days.

## 2016-10-09 NOTE — ED Notes (Signed)
Placed on antibiotic and vicodin and zofran.

## 2016-10-09 NOTE — ED Notes (Signed)
Pt discharged to home.  Family member driving.  Discharge instructions reviewed.  Verbalized understanding.  No questions or concerns at this time.  Teach back verified.  Pt in NAD.  No items left in ED.   

## 2016-10-09 NOTE — ED Notes (Signed)
Pt was seen on Saturday for left flank pain and ct and was told possible kidney stone. F[facility] and told them she had chest heaviness.  Pt states still has a chest heaviness that "feels like congestion" along with the left flank pain

## 2016-10-09 NOTE — ED Provider Notes (Signed)
[facility] Emergency Department Provider Note  ____________________________________________  Time seen: Approximately 5:22 PM  I have reviewed the triage vital signs and the nursing notes.   HISTORY  Chief Complaint Chest Pain    HPI Kelly [name] is a 50 y.o. female w/ remote hx of renal colic presenting with ongoing left flank pain. The patient was seen here 5 days ago for left leg pain with a CT that did not show any renal stone. She did have some nitrites and blood in her urine, and was discharged home with Keflex. She has now been taking that medications for times daily for 5 days and has not had any improvement in her symptoms. She reports that she is unable to take hydrocodone because she is to go to work, but has been taking 1500 mg of Tylenol at a time without improvement. She also has continued to have nausea despite taking Zofran. She reports a sharp pain in the left lateral chest wall and flank which is worse with deep breaths. She also has been having some chest heaviness and shortness of breath. The patient denies any lower extremity swelling or calf pain. She has no personal or family history of blood clots. No fever or chills, nausea or vomiting.   Past Medical History:  Diagnosis Date  . Anemia    severe due to menorrhagia, s/p hysterectomy  . Chest pain 2002   abnormal EKG, negative treadmill stress test/Holter myoview, Dr. Welton Flakes  . Obesity   . Obstructive sleep apnea 2009   mild, wears CPAP    Patient Active Problem List   Diagnosis Date Noted  . Acute frontal sinusitis 04/29/2016  . Hearing loss due to cerumen impaction 04/29/2016  . Otitis externa, eczematoid 04/29/2016  . Back pain 01/16/2016  . Impaired fasting glucose 06/18/2015  . GERD (gastroesophageal reflux disease) 06/17/2015  . Essential hypertension 10/21/2014  . Tremor of both hands 10/21/2014  . Visit for preventive health examination 09/19/2014  . S/P  hysterectomy with oophorectomy 09/19/2014  . Generalized anxiety disorder 09/18/2014  . Snoring 09/18/2014  . Irritable bowel syndrome 11/11/2013  . Other and unspecified hyperlipidemia 03/14/2013  . Menopause ovarian failure 09/13/2012  . Tremor of unknown origin 09/13/2012  . Major depressive disorder, recurrent episode with anxious distress (HCC) 07/27/2012  . Migraine headache without aura 07/27/2012  . Sciatica of left side 01/08/2012  . Obesity (BMI 30-39.9) 12/31/2011    Past Surgical History:  Procedure Laterality Date  . ABDOMINAL HYSTERECTOMY  35   secondary to post partum hemorrhage causing anemia  . GASTRIC BYPASS  2016  . SPINE SURGERY     lumbar spine    Current Outpatient Rx  . Order #: [id number] Class: Print  . Order #: [id number] Class: Normal  . Order #: [id number] Class: Print  . Order #: [id number] Class: Historical Med  . Order #: [id number] Class: Historical Med  . Order #: [id number] Class: Normal  . Order #: [id number] Class: Normal  . Order #: [id number] Class: Print  . Order #: [id number] Class: Print  . Order #: [id number] Class: Normal    Allergies Latex and Codeine  Family History  Problem Relation Age of Onset  . Hyperlipidemia Mother   . Arthritis Mother   . Hypertension Mother   . Hypertension Father   . Hypertension Brother   . Diabetes Brother   . Hyperlipidemia Brother   . Heart disease Maternal Uncle   . Coronary artery disease Maternal Uncle   . Diabetes Paternal Aunt   . Coronary artery  disease Maternal Grandfather   . Breast cancer Maternal Aunt 72    Social History Social History  Substance Use Topics  . Smoking status: Never Smoker  . Smokeless tobacco: Never Used  . Alcohol use 0.0 oz/week     Comment: occasional    Review of Systems Constitutional: No fever/chills.No lightheadedness or syncope. Eyes: No visual changes. ENT: No sore throat. No congestion or rhinorrhea. Cardiovascular: Positive left lateral and posterior chest  pain. Denies palpitations. Respiratory: Denies shortness of breath.  No cough. Gastrointestinal: Positive left flank pain. Positive suprapubic abdominal pain.  Positive nausea, no vomiting.  No diarrhea.  No constipation. Genitourinary: Negative for dysuria. Musculoskeletal: Positive for flank pain as above. Skin: Negative for rash. Neurological: Negative for headaches. No focal numbness, tingling or weakness.   10-point ROS otherwise negative.  ____________________________________________   PHYSICAL EXAM:  VITAL SIGNS: ED Triage Vitals  Enc Vitals Group     BP 10/09/16 1446 (!) 139/91     Pulse Rate 10/09/16 1445 88     Resp 10/09/16 1445 20     Temp 10/09/16 1445 98.3 F (36.8 C)     Temp Source 10/09/16 1445 Oral     SpO2 10/09/16 1445 99 %     Weight 10/09/16 1446 165 lb (74.8 kg)     Height 10/09/16 1446 5\' 5"  (1.651 m)     Head Circumference --      Peak Flow --      Pain Score 10/09/16 1501 7     Pain Loc --      Pain Edu? --      Excl. in GC? --     Constitutional: Alert and oriented. Well appearing and in no acute distress. Answers questions appropriately. Eyes: Conjunctivae are normal.  EOMI. No scleral icterus. Head: Atraumatic. Nose: No congestion/rhinnorhea. Mouth/Throat: Mucous membranes are moist.  Neck: No stridor.  Supple.   Cardiovascular: Normal rate, regular rhythm. No murmurs, rubs or gallops.  Respiratory: Normal respiratory effort.  No accessory muscle use or retractions. Lungs CTAB.  No wheezes, rales or ronchi. Gastrointestinal: Soft, and nondistended.  Mildly tender to palpation suprapubically. Positive left CVA tenderness as well as diffuse left lateral chest wall and posterior left-sided pain on palpation. No guarding or rebound.  No peritoneal signs. Musculoskeletal: No LE edema. No ttp in the calves or palpable cords.  Negative Homan's sign. Neurologic:  A&Ox3.  Speech is clear.  Face and smile are symmetric.  EOMI.  Moves all extremities  well. Skin:  Skin is warm, dry and intact. No rash noted. Psychiatric: Mood and affect are normal. Speech and behavior are normal.  Normal judgement.  ____________________________________________   LABS (all labs ordered are listed, but only abnormal results are displayed)  Labs Reviewed  BASIC METABOLIC PANEL - Abnormal; Notable for the following:       Result Value   Glucose, Bld 110 (*)    All other components within normal limits  ACETAMINOPHEN LEVEL - Abnormal; Notable for the following:    Acetaminophen (Tylenol), Serum <10 (*)    All other components within normal limits  URINE CULTURE  CBC  TROPONIN I  URINALYSIS COMPLETEWITH MICROSCOPIC ([facility] ONLY)   ____________________________________________  EKG  ED ECG REPORT I, Rockne Menghini, the attending physician, personally viewed and interpreted this ECG.   Date: 10/09/2016  EKG Time: 1444  Rate: 82  Rhythm: normal sinus rhythm  Axis: normal  Intervals:none  ST&T Change: Nonspecific T-wave inversions in V1. No ST elevation.  ____________________________________________  RADIOLOGY  Ct Angio Chest Pe W And/or Wo Contrast  Result Date: 10/09/2016 CLINICAL DATA:  Acute onset of left-sided chest pain and shortness of breath. Initial encounter. EXAM: CT ANGIOGRAPHY CHEST WITH CONTRAST TECHNIQUE: Multidetector CT imaging of the chest was performed using the standard protocol during bolus administration of intravenous contrast. Multiplanar CT image reconstructions and MIPs were obtained to evaluate the vascular anatomy. CONTRAST:  100 mL of Omnipaque 300 IV contrast COMPARISON:  None. FINDINGS: Cardiovascular: The heart is normal in size. The thoracic aorta is grossly unremarkable. No calcific atherosclerotic disease is seen. The great vessels are within normal limits. Mediastinum/Nodes: The mediastinum is unremarkable in appearance. No mediastinal lymphadenopathy is seen. No pericardial effusion is identified.  Scattered small hypodensities within the thyroid gland are likely benign, given their size. No axillary lymphadenopathy is seen. Lungs/Pleura: Minimal bibasilar atelectasis is noted. The lungs are otherwise clear. No pleural effusion or pneumothorax is seen. No masses are identified. Upper Abdomen: The visualized portions of the liver and spleen are grossly unremarkable. The visualized portions of gallbladder, pancreas and adrenal glands are unremarkable. The visualized portions of the left kidney are within normal limits. The patient is status post gastric bypass. Musculoskeletal: No acute osseous abnormalities are identified. The visualized musculature is unremarkable in appearance. Review of the MIP images confirms the above findings. IMPRESSION: Unremarkable contrast-enhanced CT of the chest. Electronically Signed   By: Roanna Raider M.D.   On: 10/09/2016 18:00    ____________________________________________   PROCEDURES  Procedure(s) performed: None  Procedures  Critical Care performed: No ____________________________________________   INITIAL IMPRESSION / ASSESSMENT AND PLAN / ED COURSE  Pertinent labs & imaging results that were available during my care of the patient were reviewed by me and considered in my medical decision making (see chart for details).  50 y.o. female presenting with ongoing left flank and left lateral chest wall pain, worse with deep breaths, and suprapubic pain despite 4 days of antibiotics for UTI. Overall, the patient has some hypertension but is otherwise afebrile. It is possible that she has ongoing infection, UTI or pyelonephritis, and we do not have microbiology on her urinalysis from 5 days ago. She may be under treating her pain, although it expect 5 days after antibiotics for her to no longer require narcotics anyway. She may just need a different antibiotic if she is resistant to Keflex. However, she does appear uncomfortable and the pain that she has is  pleuritic, so would also consider PE, spontaneous pneumothorax that is partial, early pneumonia although this is much less likely given that she has no cough. We'll plan to repeat her urinalysis, send a culture, and get a CT angiogram of the chest. I will also initiate symptomatically treatment. I am also concerned about the amount of Tylenol the patient has been taken, and we'll get a acetaminophen level.  ----------------------------------------- 7:53 PM on 10/09/2016 -----------------------------------------  At this time, the patient is feeling significantly better and her pain has resolved. Her CT scan is negative for any acute cardiopulmonary process which be causing her pain. Her white blood cell count is normal and her creatinine is normal as well. Her acetaminophen level is less than 10. I'm awaiting the results of her urinalysis. If she still has evidence of UTI, we will have her stop the Keflex and start another antibiotic. If the urine appears to be responding, I'll have her finish her Keflex course. A urine culture has been sent today. I anticipate discharge for this  patient.  ____________________________________________  FINAL CLINICAL IMPRESSION(S) / ED DIAGNOSES  Final diagnoses:  Flank pain  Left-sided chest wall pain  Suprapubic pain    Clinical Course      NEW MEDICATIONS STARTED DURING THIS VISIT:  New Prescriptions   PROMETHAZINE (PHENERGAN) 12.5 MG TABLET    Take 1 tablet (12.5 mg total) by mouth every 6 (six) hours as needed for nausea or vomiting.   PROMETHAZINE (PHENERGAN) 25 MG SUPPOSITORY    Place 1 suppository (25 mg total) rectally every 6 (six) hours as needed for nausea.      Rockne MenghiniAnne-Caroline Verdella Laidlaw, MD 10/09/16 1954

## 2016-10-11 LAB — URINE CULTURE: CULTURE: NO GROWTH

## 2016-11-04 ENCOUNTER — Encounter: Payer: Self-pay | Admitting: Internal Medicine

## 2016-11-04 ENCOUNTER — Ambulatory Visit (INDEPENDENT_AMBULATORY_CARE_PROVIDER_SITE_OTHER): Payer: BC Managed Care – PPO | Admitting: Internal Medicine

## 2016-11-04 VITALS — BP 128/88 | HR 88 | Temp 98.5°F | Resp 12 | Ht 65.0 in | Wt 161.5 lb

## 2016-11-04 DIAGNOSIS — Z23 Encounter for immunization: Secondary | ICD-10-CM | POA: Diagnosis not present

## 2016-11-04 DIAGNOSIS — Z9884 Bariatric surgery status: Secondary | ICD-10-CM

## 2016-11-04 DIAGNOSIS — R7301 Impaired fasting glucose: Secondary | ICD-10-CM

## 2016-11-04 DIAGNOSIS — E441 Mild protein-calorie malnutrition: Secondary | ICD-10-CM | POA: Diagnosis not present

## 2016-11-04 DIAGNOSIS — K909 Intestinal malabsorption, unspecified: Secondary | ICD-10-CM

## 2016-11-04 DIAGNOSIS — F411 Generalized anxiety disorder: Secondary | ICD-10-CM

## 2016-11-04 DIAGNOSIS — E559 Vitamin D deficiency, unspecified: Secondary | ICD-10-CM

## 2016-11-04 DIAGNOSIS — E669 Obesity, unspecified: Secondary | ICD-10-CM

## 2016-11-04 DIAGNOSIS — E538 Deficiency of other specified B group vitamins: Secondary | ICD-10-CM

## 2016-11-04 MED ORDER — ALPRAZOLAM 0.25 MG PO TABS: 0.2500 mg | ORAL_TABLET | Freq: Two times a day (BID) | ORAL | 0 refills | Status: DC | PRN

## 2016-11-04 MED ORDER — ALPRAZOLAM 0.25 MG PO TABS: 0.2500 mg | ORAL_TABLET | Freq: Every day | ORAL | 1 refills | Status: DC | PRN

## 2016-11-04 NOTE — Patient Instructions (Addendum)
I am prescribing generic Xanax (alprazolam) 0.25 mg to use as needed to stop a panic attack. You can repeat the dose in 15 minutes if not better.  DO NOT COMBINE WITH ALCOHOL OR NARCOTICS ,  DUE TO RISK OF OVERSEDATION AND RESPIRATROY FAILURE.  DO NOT DRIVE WHEN YO HAVE TAKEN THIS MEDICATION    Let me know by the end of the month how many attacks you have had, so we can decide if you need a daily medication to lower your anxiety level.    Use nuts and avocadoes to increase your caloric intake
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
## 2016-11-04 NOTE — Progress Notes (Signed)
Pre-visit discussion using our clinic review tool. No additional management support is needed unless otherwise documented below in the visit note.  

## 2016-11-06 DIAGNOSIS — Z9884 Bariatric surgery status: Secondary | ICD-10-CM | POA: Insufficient documentation

## 2016-11-06 DIAGNOSIS — E46 Unspecified protein-calorie malnutrition: Secondary | ICD-10-CM | POA: Insufficient documentation

## 2016-11-06 DIAGNOSIS — E559 Vitamin D deficiency, unspecified: Secondary | ICD-10-CM | POA: Insufficient documentation

## 2016-11-06 DIAGNOSIS — K909 Intestinal malabsorption, unspecified: Secondary | ICD-10-CM

## 2016-11-06 DIAGNOSIS — E538 Deficiency of other specified B group vitamins: Secondary | ICD-10-CM | POA: Insufficient documentation

## 2016-11-06 NOTE — Assessment & Plan Note (Signed)
Secondary to gastric bypass  level was 17 in march via EPIC portal by outside lab  Needs Drisdol
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
## 2016-11-06 NOTE — Assessment & Plan Note (Signed)
Oct 2016.  92 lb weight loss thus far.  B12 and Vit D deficiencies noted in March via review of outside labs..  Will recommend repeat her if not done.

## 2016-11-06 NOTE — Assessment & Plan Note (Signed)
Body mass index is 26.88 kg/m.She has lost 92 lbs since undergoing Rou en Y procedure in late Oct 2016.  Advised her to increase her exercise to 5 times daily , 30 minutes,  And continue to eat 6 smaller meals daily to avoid stretching her stomach Patient personal goal is 160 lbs.  [facility] goal is 154 lbs

## 2016-11-06 NOTE — Assessment & Plan Note (Addendum)
Assured that monthly B12 injections will suffice . B12 level was 268 in March by outside labs (via EPIC portal)   No results found for: [id number]

## 2016-11-06 NOTE — Assessment & Plan Note (Signed)
Secondary to decreased caloric intake since her bariatric surgery one year ago.  I have reviewed her diet and recommended that she increase her protein and fat intake while monitoring her carbohydrates.

## 2017-02-01 ENCOUNTER — Encounter: Payer: Self-pay | Admitting: Emergency Medicine

## 2017-02-01 DIAGNOSIS — Z79899 Other long term (current) drug therapy: Secondary | ICD-10-CM | POA: Diagnosis not present

## 2017-02-01 DIAGNOSIS — R102 Pelvic and perineal pain: Secondary | ICD-10-CM | POA: Diagnosis present

## 2017-02-01 DIAGNOSIS — I10 Essential (primary) hypertension: Secondary | ICD-10-CM | POA: Insufficient documentation

## 2017-02-01 DIAGNOSIS — N12 Tubulo-interstitial nephritis, not specified as acute or chronic: Secondary | ICD-10-CM | POA: Insufficient documentation

## 2017-02-01 LAB — CBC
HCT: 37 % (ref 35.0–47.0)
HEMOGLOBIN: 12.5 g/dL (ref 12.0–16.0)
MCH: 29.8 pg (ref 26.0–34.0)
MCHC: 33.8 g/dL (ref 32.0–36.0)
MCV: 88.2 fL (ref 80.0–100.0)
PLATELETS: 152 10*3/uL (ref 150–440)
RBC: 4.2 MIL/uL (ref 3.80–5.20)
RDW: 13.7 % (ref 11.5–14.5)
WBC: 6.4 10*3/uL (ref 3.6–11.0)

## 2017-02-01 LAB — COMPREHENSIVE METABOLIC PANEL
ALBUMIN: 4 g/dL (ref 3.5–5.0)
ALK PHOS: 52 U/L (ref 38–126)
ALT: 18 U/L (ref 14–54)
ANION GAP: 6 (ref 5–15)
AST: 23 U/L (ref 15–41)
BILIRUBIN TOTAL: 0.4 mg/dL (ref 0.3–1.2)
BUN: 11 mg/dL (ref 6–20)
CALCIUM: 8.7 mg/dL — AB (ref 8.9–10.3)
CO2: 27 mmol/L (ref 22–32)
CREATININE: 0.6 mg/dL (ref 0.44–1.00)
Chloride: 105 mmol/L (ref 101–111)
GFR calc non Af Amer: >60 mL/min (ref >60)
GLUCOSE: 114 mg/dL — AB (ref 65–99)
Potassium: 3.6 mmol/L (ref 3.5–5.1)
Sodium: 138 mmol/L (ref 135–145)
TOTAL PROTEIN: 7.2 g/dL (ref 6.5–8.1)

## 2017-02-01 LAB — URINALYSIS, COMPLETE (UACMP) WITH MICROSCOPIC
Bilirubin Urine: NEGATIVE
GLUCOSE, UA: NEGATIVE mg/dL
Ketones, ur: NEGATIVE mg/dL
Leukocytes, UA: NEGATIVE
NITRITE: POSITIVE — AB
PROTEIN: NEGATIVE mg/dL
Specific Gravity, Urine: 1.013 (ref 1.005–1.030)
pH: 6 (ref 5.0–8.0)

## 2017-02-01 LAB — LIPASE, BLOOD: Lipase: 32 U/L (ref 11–51)

## 2017-02-01 NOTE — ED Notes (Signed)
Pt report ovarian pain and vaginal discharge; ambulatory with steady gait; talking in complete coherent sentences

## 2017-02-01 NOTE — ED Triage Notes (Signed)
Pt ambulatory to triage with no difficulty. Pt reports developed lower right abd pain about 4 days ago. Pt reports it feels like her ovary. Pt also reports she developed small amount of bloody discharge from her vagina 2 days ago. Pt has had partial hysterectomy but still has her ovaries.

## 2017-02-02 ENCOUNTER — Emergency Department
Admission: EM | Admit: 2017-02-02 | Discharge: 2017-02-02 | Disposition: A | Discharge status: Home / Self Care | Payer: BC Managed Care – PPO | Attending: Emergency Medicine | Admitting: Emergency Medicine

## 2017-02-02 DIAGNOSIS — N12 Tubulo-interstitial nephritis, not specified as acute or chronic: Secondary | ICD-10-CM

## 2017-02-02 LAB — CHLAMYDIA/NGC RT PCR (ARMC ONLY)
CHLAMYDIA TR: NOT DETECTED
N GONORRHOEAE: NOT DETECTED

## 2017-02-02 LAB — WET PREP, GENITAL
Clue Cells Wet Prep HPF POC: NONE SEEN
Sperm: NONE SEEN
TRICH WET PREP: NONE SEEN
Yeast Wet Prep HPF POC: NONE SEEN

## 2017-02-02 MED ORDER — CEPHALEXIN 500 MG PO CAPS
500.0000 mg | ORAL_CAPSULE | Freq: Once | ORAL | Status: AC
  Administered 2017-02-02: 500 mg via ORAL
  Filled 2017-02-02: qty 1

## 2017-02-02 MED ORDER — CEPHALEXIN 500 MG PO CAPS: 500.0000 mg | ORAL_CAPSULE | Freq: Three times a day (TID) | ORAL | 0 refills | Status: AC

## 2017-02-02 NOTE — ED Notes (Signed)
Pt states that she has been having rlq pain for the past 4 days with some painful urination, pt also states that she has had a pink colored discharge and grew concerned because she had a hysterectomy 10 years ago. Pt reports some nausea, reports regular bowel movements but feels like her abd is bloated.

## 2017-02-02 NOTE — ED Provider Notes (Signed)
[facility] Emergency Department Provider Note  ____________________________________________   First MD Initiated Contact with Patient 02/02/17 0010     (approximate)  I have reviewed the triage vital signs and the nursing notes.   HISTORY  Chief Complaint Abdominal Pain and Vaginal Bleeding   HPI Berenice PrimasJacqueline C Sagona is a 51 y.o. female with a history of gastric bypass who is presenting to the emergency department today with 4 days of suprapubic and right lower quadrant abdominal pain which radiates through to the back. She says it is so similar nausea as well as a bloody vaginal discharge and burning and frequency with urination. She says that she may have a UTI and has had similar symptoms in the past without the pain. She also says that she has had bleeding discharge in the past. UTI. However, she has not had pain in the past with UTI.   Past Medical History:  Diagnosis Date  . Anemia    severe due to menorrhagia, s/p hysterectomy  . Chest pain 2002   abnormal EKG, negative treadmill stress test/Holter myoview, Dr. Welton FlakesKhan  . Obesity   . Obstructive sleep apnea 2009   mild, wears CPAP    Patient Active Problem List   Diagnosis Date Noted  . Protein-calorie malnutrition (HCC) 11/06/2016  . Vitamin B12 deficiency due to intestinal malabsorption 11/06/2016  . Vitamin D deficiency 11/06/2016  . S/P bariatric surgery 11/06/2016  . Back pain 01/16/2016  . Impaired fasting glucose 06/18/2015  . GERD (gastroesophageal reflux disease) 06/17/2015  . Essential hypertension 10/21/2014  . Visit for preventive health examination 09/19/2014  . S/P hysterectomy with oophorectomy 09/19/2014  . Generalized anxiety disorder 09/18/2014  . Snoring 09/18/2014  . Irritable bowel syndrome 11/11/2013  . Menopause ovarian failure 09/13/2012  . Major depressive disorder, recurrent episode with anxious distress (HCC) 07/27/2012  . Migraine headache without aura  07/27/2012  . Sciatica of left side 01/08/2012  . Obesity (BMI 30-39.9) 12/31/2011    Past Surgical History:  Procedure Laterality Date  . ABDOMINAL HYSTERECTOMY  35   secondary to post partum hemorrhage causing anemia  . GASTRIC BYPASS  2016  . SPINE SURGERY     lumbar spine    Prior to Admission medications   Medication Sig Start Date End Date Taking? Authorizing Provider  ALPRAZolam (XANAX) 0.25 MG tablet Take 1 tablet (0.25 mg total) by mouth daily as needed for anxiety. 11/04/16   Sherlene Shamseresa L [name], MD  cyanocobalamin (,VITAMIN B-12,) 1000 MCG/ML injection For use with monthly B12 injections 04/28/16   Sherlene Shamseresa L [name], MD  promethazine (PHENERGAN) 12.5 MG tablet Take 1 tablet (12.5 mg total) by mouth every 6 (six) hours as needed for nausea or vomiting. 10/09/16   Rockne MenghiniAnne-Caroline Norman, MD  promethazine (PHENERGAN) 25 MG suppository Place 1 suppository (25 mg total) rectally every 6 (six) hours as needed for nausea. 10/09/16 10/09/17  Anne-Caroline Sharma CovertNorman, MD  pseudoephedrine-acetaminophen (TYLENOL SINUS) 30-500 MG TABS tablet Take 2 tablets by mouth every 4 (four) hours as needed.    Historical Provider, MD  sucralfate (CARAFATE) 1 g tablet Take 1 tablet by mouth 4 (four) times daily. 10/01/16   Historical Provider, MD  Syringe, Disposable, 1 ML MISC FOR USE WITH b12 INJECTIBLE SOLUTION 04/28/16   Sherlene Shamseresa L [name], MD    Allergies Latex and Codeine  Family History  Problem Relation Age of Onset  . Hyperlipidemia Mother   . Arthritis Mother   . Hypertension Mother   . Hypertension Father   .  Hypertension Brother   . Diabetes Brother   . Hyperlipidemia Brother   . Heart disease Maternal Uncle   . Coronary artery disease Maternal Uncle   . Diabetes Paternal Aunt   . Coronary artery disease Maternal Grandfather   . Breast cancer Maternal Aunt 63    Social History Social History  Substance Use Topics  . Smoking status: Never Smoker  . Smokeless tobacco: Never Used  . Alcohol  use 0.0 oz/week     Comment: occasional    Review of Systems Constitutional: No fever/chills Eyes: No visual changes. ENT: No sore throat. Cardiovascular: Denies chest pain. Respiratory: Denies shortness of breath. Gastrointestinal:  no vomiting.  No diarrhea.  No constipation. Genitourinary: as above Musculoskeletal: Negative for back pain. Skin: Negative for rash. Neurological: Negative for headaches, focal weakness or numbness.  10-point ROS otherwise negative.  ____________________________________________   PHYSICAL EXAM:  VITAL SIGNS: ED Triage Vitals  Enc Vitals Group     BP 02/01/17 2124 (!) 145/95     Pulse Rate 02/01/17 2124 75     Resp 02/01/17 2124 18     Temp 02/01/17 2124 98.6 F (37 C)     Temp Source 02/01/17 2124 Oral     SpO2 02/01/17 2124 99 %     Weight 02/01/17 2125 154 lb (69.9 kg)     Height 02/01/17 2125 5\' 5"  (1.651 m)     Head Circumference --      Peak Flow --      Pain Score 02/01/17 2125 8     Pain Loc --      Pain Edu? --      Excl. in GC? --     Constitutional: Alert and oriented. Well appearing and in no acute distress. Eyes: Conjunctivae are normal. PERRL. EOMI. Head: Atraumatic. Nose: No congestion/rhinnorhea. Mouth/Throat: Mucous membranes are moist.  Oropharynx non-erythematous. Neck: No stridor.   Cardiovascular: Normal rate, regular rhythm. Grossly normal heart sounds.  Good peripheral circulation. Respiratory: Normal respiratory effort.  No retractions. Lungs CTAB. Gastrointestinal: Soft With mild right upper quadrant tenderness palpation with a negative Murphy sign. Also with mild right lower quadrant as well as superficial tenderness palpation without any rebound or guarding. No distention. Mild right-sided CVA tenderness to palpation. Genitourinary:  Normal external exam. Speculum exam with a moderate amount of clear to greenish discharge. Bimanual exam with mild suprapubic as well as right adnexal tenderness to palpation  without any left adnexal tenderness to palpation. No masses palpated. Musculoskeletal: No lower extremity tenderness nor edema.  No joint effusions. Neurologic:  Normal speech and language. No gross focal neurologic deficits are appreciated. No gait instability. Skin:  Skin is warm, dry and intact. No rash noted. Psychiatric: Mood and affect are normal. Speech and behavior are normal.  ____________________________________________   LABS (all labs ordered are listed, but only abnormal results are displayed)  Labs Reviewed  WET PREP, GENITAL - Abnormal; Notable for the following:       Result Value   WBC, Wet Prep HPF POC FEW (*)    All other components within normal limits  COMPREHENSIVE METABOLIC PANEL - Abnormal; Notable for the following:    Glucose, Bld 114 (*)    Calcium 8.7 (*)    All other components within normal limits  URINALYSIS, COMPLETE (UACMP) WITH MICROSCOPIC - Abnormal; Notable for the following:    Color, Urine YELLOW (*)    APPearance CLEAR (*)    Hgb urine dipstick SMALL (*)  Nitrite POSITIVE (*)    Bacteria, UA MANY (*)    Squamous Epithelial / LPF 0-5 (*)    All other components within normal limits  CHLAMYDIA/NGC RT PCR ([facility] ONLY)  LIPASE, BLOOD  CBC   ____________________________________________  EKG   ____________________________________________  RADIOLOGY   ____________________________________________   PROCEDURES  Procedure(s) performed:   Procedures  Critical Care performed:   ____________________________________________   INITIAL IMPRESSION / ASSESSMENT AND PLAN / ED COURSE  Pertinent labs & imaging results that were available during my care of the patient were reviewed by me and considered in my medical decision making (see chart for details).  ----------------------------------------- 1:55 AM on 02/02/2017 -----------------------------------------  Patient resting a little bit this time. Says that she does get  intermittent nausea after having her gastric bypass surgery. Patient with mild tenderness on the exam with a normal white blood cell count. Also with a positive UA. Patient with burning on urination as well as frequency and right-sided CVA tenderness. We'll treat for UTI/pyelonephritis. Because of the patient's right-sided abdominal pain she was given strict return precautions, back immediately for any worsening or concerning symptoms. We discussed a CAT scan and I offered this to the patient at this time but she says that she would rather come back if her symptoms worsen. I feel this is reasonable given the constellation of symptoms and normal white blood cell count.      ____________________________________________   FINAL CLINICAL IMPRESSION(S) / ED DIAGNOSES  Pyelonephritis.    NEW MEDICATIONS STARTED DURING THIS VISIT:  New Prescriptions   No medications on file     Note:  This document was prepared using Dragon voice recognition software and may include unintentional dictation errors.    Myrna Blazer, MD 02/02/17 0157

## 2017-07-01 ENCOUNTER — Telehealth: Payer: Self-pay | Admitting: Internal Medicine

## 2017-07-01 NOTE — Telephone Encounter (Signed)
Reason for call: Dizziness, HR increasing, stable at the moment  Symptoms: SOB, palpitating, chest, pain in the shoulder  Duration [facility]  [facility] and off for 3 weeks  Medications: none  Last seen for this problem:06/14/2017   [facility]  Advise patient to go to ER but doesn't want to go, stated she wanted to be seen by Dr. [name]. Dr [name] not in office and will wait on response form provider

## 2017-07-01 NOTE — Telephone Encounter (Signed)
Patient should be evaluated today at the emergency room given her symptoms. If she does not want to be evaluated in the emergency room she should be evaluated at an urgent care.

## 2017-07-01 NOTE — Telephone Encounter (Signed)
Left detail message for patient to go to ER or Urgent care to be reevaluated

## 2017-07-01 NOTE — Telephone Encounter (Signed)
Patient was informed of of Dr. Purvis SheffieldSonnenberg's statement , she will follow up with Urgent Care

## 2017-07-01 NOTE — Telephone Encounter (Signed)
Please call patient, thanks get more information

## 2017-07-01 NOTE — Telephone Encounter (Signed)
Pt called and stated that she has been having dizzy spells that last for a few minutes and then her heart rate goes up. Pt states that this has been happening for the last few weeks. Please advise, thank you!  Call pt @ [phone]

## 2017-07-23 ENCOUNTER — Ambulatory Visit (INDEPENDENT_AMBULATORY_CARE_PROVIDER_SITE_OTHER): Payer: BC Managed Care – PPO | Admitting: Family Medicine

## 2017-07-23 ENCOUNTER — Encounter: Payer: Self-pay | Admitting: Family Medicine

## 2017-07-23 VITALS — BP 104/58 | HR 75 | Temp 98.1°F | Resp 14 | Ht 65.05 in | Wt 144.9 lb

## 2017-07-23 DIAGNOSIS — Z9884 Bariatric surgery status: Secondary | ICD-10-CM

## 2017-07-23 DIAGNOSIS — R7301 Impaired fasting glucose: Secondary | ICD-10-CM | POA: Diagnosis not present

## 2017-07-23 DIAGNOSIS — E538 Deficiency of other specified B group vitamins: Secondary | ICD-10-CM

## 2017-07-23 DIAGNOSIS — Z114 Encounter for screening for human immunodeficiency virus [HIV]: Secondary | ICD-10-CM | POA: Diagnosis not present

## 2017-07-23 DIAGNOSIS — R079 Chest pain, unspecified: Secondary | ICD-10-CM | POA: Diagnosis not present

## 2017-07-23 DIAGNOSIS — E559 Vitamin D deficiency, unspecified: Secondary | ICD-10-CM | POA: Diagnosis not present

## 2017-07-23 DIAGNOSIS — K909 Intestinal malabsorption, unspecified: Secondary | ICD-10-CM

## 2017-07-23 DIAGNOSIS — R52 Pain, unspecified: Secondary | ICD-10-CM | POA: Insufficient documentation

## 2017-07-23 LAB — CBC WITH DIFFERENTIAL/PLATELET
BASOS PCT: 0 %
Basophils Absolute: 0 cells/uL (ref 0–200)
EOS PCT: 1 %
Eosinophils Absolute: 57 cells/uL (ref 15–500)
HCT: 38.2 % (ref 35.0–45.0)
Hemoglobin: 12.8 g/dL (ref 11.7–15.5)
LYMPHS PCT: 40 %
Lymphs Abs: 2280 cells/uL (ref 850–3900)
MCH: 29.9 pg (ref 27.0–33.0)
MCHC: 33.5 g/dL (ref 32.0–36.0)
MCV: 89.3 fL (ref 80.0–100.0)
MPV: 10.6 fL (ref 7.5–12.5)
Monocytes Absolute: 228 cells/uL (ref 200–950)
Monocytes Relative: 4 %
NEUTROS PCT: 55 %
Neutro Abs: 3135 cells/uL (ref 1500–7800)
PLATELETS: 197 10*3/uL (ref 140–400)
RBC: 4.28 MIL/uL (ref 3.80–5.10)
RDW: 13.5 % (ref 11.0–15.0)
WBC: 5.7 10*3/uL (ref 3.8–10.8)

## 2017-07-23 LAB — COMPLETE METABOLIC PANEL WITH GFR
ALT: 15 U/L (ref 6–29)
AST: 15 U/L (ref 10–35)
Albumin: 4.1 g/dL (ref 3.6–5.1)
Alkaline Phosphatase: 64 U/L (ref 33–130)
BUN: 9 mg/dL (ref 7–25)
CALCIUM: 9.2 mg/dL (ref 8.6–10.4)
CHLORIDE: 105 mmol/L (ref 98–110)
CO2: 27 mmol/L (ref 20–31)
Creat: 0.61 mg/dL (ref 0.50–1.05)
GFR, Est African American: >89 mL/min (ref >=60)
GFR, Est Non African American: >89 mL/min (ref >=60)
Glucose, Bld: 95 mg/dL (ref 65–99)
POTASSIUM: 3.6 mmol/L (ref 3.5–5.3)
SODIUM: 141 mmol/L (ref 135–146)
Total Bilirubin: 0.5 mg/dL (ref 0.2–1.2)
Total Protein: 6.9 g/dL (ref 6.1–8.1)

## 2017-07-23 LAB — LIPID PANEL
CHOL/HDL RATIO: 1.9 ratio (ref <5.0)
CHOLESTEROL: 181 mg/dL (ref <200)
HDL: 93 mg/dL (ref >50)
LDL CALC: 76 mg/dL (ref <100)
Triglycerides: 61 mg/dL (ref <150)
VLDL: 12 mg/dL (ref <30)

## 2017-07-23 MED ORDER — VITAMIN B-12 1000 MCG SL SUBL: 1.0000 | SUBLINGUAL_TABLET | Freq: Every day | SUBLINGUAL | 11 refills | Status: DC

## 2017-07-23 NOTE — Assessment & Plan Note (Signed)
Refer to cardiologist; cannot start aspirin because of gastric bypass; please call 911 in the meantime if any future episodes

## 2017-07-23 NOTE — Assessment & Plan Note (Signed)
Use the SL pills now, convert back to injections once a month if needed

## 2017-07-23 NOTE — Patient Instructions (Signed)
We'll have you see the cardiologist If you develop chest pain before than, call 911 We'll get labs today If you have not heard anything from my staff in a week about any orders/referrals/studies from today, please contact us here to follow-up [phone]

## 2017-07-23 NOTE — Progress Notes (Signed)
BP (!) 104/58   Pulse 75   Temp 98.1 F (36.7 C) (Oral)   Resp 14   Ht 5' 5.05" (1.652 m)   Wt 144 lb 14.4 oz (65.7 kg)   SpO2 97%   BMI 24.08 kg/m    Subjective:    Patient ID: Kelly [name], female    DOB: 09-Sep-1966, 51 y.o.   MRN: [id number]  HPI: Kelly [name] is a 51 y.o. female  Chief Complaint  Patient presents with  . Establish Care    New Patient     HPI  Patient is here to establish care She says that about a month ago, she started to have chest pain; was having numbness and tingling in her right arm She went to the ER; happening again; numbness in the arms and dizzy spells; tried to get in with her family doctor and they sent her back to the ER Woke her from sleep and vomited twice, mild SHOB Family hx of cardiac issues; brother had a MI at age 76; obese, nonsmoker; grandmother died right at age 90 of heart disease on father's side and grandfather on mother's side had heart attack, killed him at age 27 Chest pain was at rest, none with walking except hard to breathe somewhat  Prediabetes; time for A1c; several aunts with diabetes and brother; having dry mouth and blurred vision  Hx of high blood presure, but not now after weight loss  Status post bariatric surgery; peak weight 240 pounds; she says her surgeon moved away and she has not had follow-up; she quit doing the B12 shots  High cholesterol; more with weight issue; not eating many fatty meats  Has has body aches, even before the surgery; eased up after surgery but now back; both muscle and joint; fam hx of autoimmune rheumatologic issues  Depression screen [facility] 2/9 07/23/2017 10/03/2015  Decreased Interest 0 0  Down, Depressed, Hopeless 0 0  PHQ - 2 Score 0 0    Relevant past medical, surgical, family and social history reviewed Past Medical History:  Diagnosis Date  . Chest pain 2002   abnormal EKG, negative treadmill stress test/Holter myoview, Dr. Welton Flakes   Past Surgical History:    Procedure Laterality Date  . ABDOMINAL HYSTERECTOMY  35   secondary to post partum hemorrhage causing anemia  . GASTRIC BYPASS  2016  . SPINE SURGERY     lumbar spine, L4-L5 ruptured disc   Family History  Problem Relation Age of Onset  . Hyperlipidemia Mother   . Arthritis Mother   . Hypertension Mother   . Hypertension Father   . Heart disease Maternal Uncle   . Coronary artery disease Maternal Uncle   . Diabetes Paternal Aunt   . Coronary artery disease Maternal Grandfather   . Heart attack Maternal Grandfather   . Breast cancer Maternal Aunt 45  . Colon cancer Maternal Grandmother   . Heart disease Paternal Grandmother   . Diabetes Brother   . Hyperlipidemia Brother   . Hypertension Brother    Social History   Social History  . Marital status: Married    Spouse name: N/A  . Number of children: N/A  . Years of education: N/A   Occupational History  . Part-time     Midwife   Social History Main Topics  . Smoking status: Never Smoker  . Smokeless tobacco: Never Used  . Alcohol use No     Comment: occasional  . Drug use: No  .  Sexual activity: Yes   Other Topics Concern  . Not on file   Social History Narrative  . No narrative on file    Interim medical history since last visit reviewed. Allergies and medications reviewed  Review of Systems  Neurological: Positive for tremors.   Per HPI unless specifically indicated above     Objective:    BP (!) 104/58   Pulse 75   Temp 98.1 F (36.7 C) (Oral)   Resp 14   Ht 5' 5.05" (1.652 m)   Wt 144 lb 14.4 oz (65.7 kg)   SpO2 97%   BMI 24.08 kg/m   Wt Readings from Last 3 Encounters:  07/23/17 144 lb 14.4 oz (65.7 kg)  02/01/17 154 lb (69.9 kg)  11/04/16 161 lb 8 oz (73.3 kg)    Physical Exam  Constitutional: She appears well-developed and well-nourished. No distress.  HENT:  Head: Normocephalic and atraumatic.  Eyes: EOM are normal. No scleral icterus.  Neck: No thyromegaly present.   Cardiovascular: Normal rate, regular rhythm and normal heart sounds.   No murmur heard. Pulmonary/Chest: Breath sounds normal. She has no wheezes.  Abdominal: Soft. Bowel sounds are normal. She exhibits no distension.  Musculoskeletal: Normal range of motion. She exhibits no edema.  No MCP enlargement or tenderness, no enlargement or tenderness of the base of the thumbs  Neurological: She is alert. She exhibits normal muscle tone.  Skin: Skin is warm and dry. She is not diaphoretic. No pallor.  Psychiatric: She has a normal mood and affect. Her behavior is normal. Judgment and thought content normal.      Assessment & Plan:   Problem List Items Addressed This Visit      Digestive   Vitamin B12 deficiency due to intestinal malabsorption    Use the SL pills now, convert back to injections once a month if needed        Endocrine   Impaired fasting glucose    Check A1c today      Relevant Orders   Lipid panel   Hemoglobin A1c     Other   Vitamin D deficiency    Check vit D level and supplement if needed      Relevant Orders   VITAMIN D 25 Hydroxy (Vit-D Deficiency, Fractures)   S/P bariatric surgery    Surgeon left the area; check labs      Relevant Medications   Cyanocobalamin (VITAMIN B-12) 1000 MCG SUBL   Other Relevant Orders   CBC with Differential/Platelet   COMPLETE METABOLIC PANEL WITH GFR   Magnesium   VITAMIN D 25 Hydroxy (Vit-D Deficiency, Fractures)   TSH   B12   Vitamin B1   Chest pain at rest - Primary    Refer to cardiologist; cannot start aspirin because of gastric bypass; please call 911 in the meantime if any future episodes      Relevant Orders   Ambulatory referral to Cardiology   Body aches    Check labs, r/o autoimmune condition      Relevant Orders   ANA,IFA RA Diag Pnl w/rflx Tit/Patn    Other Visit Diagnoses    Screening for HIV (human immunodeficiency virus)       Relevant Orders   HIV antibody (with reflex)       Follow  up plan: Return in about 6 months (around 01/23/2018) for twenty minute follow-up with fasting labs.  An after-visit summary was printed and given to the patient at check-out.  Please see  the patient instructions which may contain other information and recommendations beyond what is mentioned above in the assessment and plan.  Meds ordered this encounter  Medications  . DISCONTD: celecoxib (CELEBREX) 200 MG capsule    Sig: Take 200 mg by mouth 2 (two) times daily.  . Cyanocobalamin (VITAMIN B-12) 1000 MCG SUBL    Sig: Place 1 tablet (1,000 mcg total) under the tongue daily.    Dispense:  30 tablet    Refill:  11    Orders Placed This Encounter  Procedures  . HIV antibody (with reflex)  . CBC with Differential/Platelet  . COMPLETE METABOLIC PANEL WITH GFR  . Lipid panel  . Magnesium  . VITAMIN D 25 Hydroxy (Vit-D Deficiency, Fractures)  . TSH  . Hemoglobin A1c  . B12  . Vitamin B1  . ANA,IFA RA Diag Pnl w/rflx Tit/Patn  . Ambulatory referral to Cardiology

## 2017-07-23 NOTE — Assessment & Plan Note (Signed)
Check vit D level and supplement if needed 

## 2017-07-23 NOTE — Assessment & Plan Note (Signed)
Check A1c today.

## 2017-07-23 NOTE — Assessment & Plan Note (Signed)
Surgeon left the area; check labs

## 2017-07-23 NOTE — Assessment & Plan Note (Signed)
Check labs, r/o autoimmune condition

## 2017-07-24 LAB — VITAMIN D 25 HYDROXY (VIT D DEFICIENCY, FRACTURES): VIT D 25 HYDROXY: 16 ng/mL — AB (ref 30–100)

## 2017-07-24 LAB — HEMOGLOBIN A1C
Hgb A1c MFr Bld: 5.2 % (ref <5.7)
Mean Plasma Glucose: 103 mg/dL

## 2017-07-24 LAB — TSH: TSH: 1.27 mIU/L

## 2017-07-24 LAB — VITAMIN B12: VITAMIN B 12: 236 pg/mL (ref 200–1100)

## 2017-07-24 LAB — ANA,IFA RA DIAG PNL W/RFLX TIT/PATN
Anti Nuclear Antibody(ANA): NEGATIVE
Rhuematoid fact SerPl-aCnc: <14 IU/mL (ref <14)

## 2017-07-24 LAB — HIV ANTIBODY (ROUTINE TESTING W REFLEX): HIV: NONREACTIVE

## 2017-07-24 LAB — MAGNESIUM: Magnesium: 1.9 mg/dL (ref 1.5–2.5)

## 2017-07-28 ENCOUNTER — Other Ambulatory Visit: Payer: Self-pay | Admitting: Family Medicine

## 2017-07-28 LAB — VITAMIN B1: Vitamin B1 (Thiamine): 6 nmol/L — ABNORMAL LOW (ref 8–30)

## 2017-07-28 MED ORDER — VITAMIN D (ERGOCALCIFEROL) 1.25 MG (50000 UNIT) PO CAPS: 50000.0000 [IU] | ORAL_CAPSULE | ORAL | 1 refills | Status: DC

## 2017-07-28 NOTE — Progress Notes (Signed)
Vit D rx for 8 weeks

## 2017-08-03 NOTE — Progress Notes (Signed)
Cardiology Office Note  Date:  08/04/2017   ID:  Kelly PrimasJacqueline C [name], DOB [birth year], MRN [id number]  PCP:  Kerman PasseyLada, Melinda P, MD   Chief Complaint  Patient presents with  . OTHER    C/o chest pain, dizziness and numbness. Meds reviewed verbally with pt.    HPI:  [name] is a resident 51 year old woman with past medical history of Morbid obesity, peak weight 240 pounds;  Gastric bypass surgery Previous diabetes, improvement with weight loss,  Who presents by referral from Dr. Sherie DonLada for consultation of chest pain and palpitations  She reports that she developed episode of chest pain in late June 2018 Also had some numbness and tingling down her right arm Symptoms developed at nighttime, had vomiting 2 Mild shortness of breath Chest pain nonradiating She went to the ER June 14 2017; cardiac enzymes negative, given nitroglycerin EKG was relatively benign Right upper quadrant ultrasound unremarkable, no gallstones Discharged with no further workup  She reports having episodes of palpitations, symptomatic, lasting for several minutes at a time Typically palpitations once per week, sometimes up to 10 minutes Heart rate feels fast, very strong   She does report having family history of coronary disease brother had a MI at age 51;  Dad with MI, in 4250s  CT scan chest and abdomen reviewed showing no significant coronary calcifications, no aortic atherosclerosis Images pulled up in the office and discussed with her in detail  EKG personally reviewed by myself on todays visit Normal sinus rhythm with a significant ST or T-wave changes, rate 78 bpm   PMH:   has a past medical history of Chest pain (2002).  PSH:    Past Surgical History:  Procedure Laterality Date  . ABDOMINAL HYSTERECTOMY  35   secondary to post partum hemorrhage causing anemia  . GASTRIC BYPASS  2016  . SPINE SURGERY     lumbar spine, L4-L5 ruptured disc    Current Outpatient Prescriptions  Medication Sig  Dispense Refill  . Cyanocobalamin (VITAMIN B-12) 1000 MCG SUBL Place 1 tablet (1,000 mcg total) under the tongue daily. 30 tablet 11  . Multiple Vitamin (MULTIVITAMIN) capsule Take 1 capsule by mouth daily.    . pseudoephedrine-acetaminophen (TYLENOL SINUS) 30-500 MG TABS tablet Take 2 tablets by mouth every 4 (four) hours as needed.    . Vitamin D, Ergocalciferol, (DRISDOL) 50000 units CAPS capsule Take 1 capsule (50,000 Units total) by mouth every 7 (seven) days. 4 capsule 1  . metoprolol succinate (TOPROL-XL) 25 MG 24 hr tablet Take 1 tablet (25 mg total) by mouth daily. Take with or immediately following a meal. 30 tablet 11   No current facility-administered medications for this visit.      Allergies:   Latex and Codeine   Social History:  The patient  reports that she has never smoked. She has never used smokeless tobacco. She reports that she does not drink alcohol or use drugs.   Family History:   family history includes Arthritis in her mother; Breast cancer (age of onset: 4045) in her maternal aunt; Colon cancer in her maternal grandmother; Coronary artery disease in her maternal grandfather and maternal uncle; Diabetes in her brother and paternal aunt; Heart attack in her maternal grandfather; Heart disease in her brother, father, maternal uncle, and paternal grandmother; Hyperlipidemia in her brother and mother; Hypertension in her brother, father, and mother.    Review of Systems: Review of Systems  Constitutional: Negative.   Respiratory: Negative.   Cardiovascular: Positive for  chest pain and palpitations.  Gastrointestinal: Negative.   Musculoskeletal: Negative.   Neurological: Negative.   Psychiatric/Behavioral: Negative.   All other systems reviewed and are negative.    PHYSICAL EXAM: VS:  BP 132/90 (BP Location: Right Arm, Patient Position: Sitting, Cuff Size: Normal)   Pulse 78   Ht 5\' 5"  (1.651 m)   Wt 147 lb 8 oz (66.9 kg)   BMI 24.55 kg/m  , BMI Body mass  index is 24.55 kg/m. GEN: Well nourished, well developed, in no acute distress  HEENT: normal  Neck: no JVD, carotid bruits, or masses Cardiac: RRR; no murmurs, rubs, or gallops,no edema  Respiratory:  clear to auscultation bilaterally, normal work of breathing GI: soft, nontender, nondistended, + BS MS: no deformity or atrophy  Skin: warm and dry, no rash Neuro:  Strength and sensation are intact Psych: euthymic mood, full affect    Recent Labs: 07/23/2017: ALT 15; BUN 9; Creat 0.61; Hemoglobin 12.8; Magnesium 1.9; Platelets 197; Potassium 3.6; Sodium 141; TSH 1.27    Lipid Panel Lab Results  Component Value Date   CHOL 181 07/23/2017   HDL 93 07/23/2017   LDLCALC 76 07/23/2017   TRIG 61 07/23/2017      Wt Readings from Last 3 Encounters:  08/04/17 147 lb 8 oz (66.9 kg)  07/23/17 144 lb 14.4 oz (65.7 kg)  02/01/17 154 lb (69.9 kg)       ASSESSMENT AND PLAN:  Chest pain at rest - Plan: EKG 12-Lead Atypical in nature, previous workup negative in the emergency room CT scan reviewed chest and abdomen with no coronary calcifications, no aortic atherosclerosis Nonsmoker, now with improved diabetes, total cholesterol 180 prior to recent weight loss No further episodes of chest discomfort even with exertion Suggested no further workup at this time.  Recommended she call our office if she has chest discomfort with exertion concerning for angina  Major depressive disorder, recurrent episode with anxious distress (HCC) - Plan: EKG 12-Lead Managed by primary care  Essential hypertension - Plan: EKG 12-Lead Blood pressure is well controlled on today's visit. No changes made to the medications.  Palpitations Etiology unclear, possible atrial tachycardia, unable to exclude SVT run or ectopy Suggested she could try low-dose metoprolol succinate 12.5 mg daily with slow titration upwards Symptoms are infrequent and short-lived, would be difficult to use propranolol We have  offered a monitor if symptoms get worse 14 day or 30 day  S/P bariatric surgery Dramatic weight loss, recommended she try to stabilize her weight  Disposition:   F/U   as needed   Patient seen in consultation for Dr.  Sherie Don and will be referred back to her office for ongoing care of the medical issues detailed above    Orders Placed This Encounter  Procedures  . EKG 12-Lead     Signed, Dossie Arbour, M.D., Ph.D. 08/04/2017  [facility] [place], [place] [phone]

## 2017-08-04 ENCOUNTER — Encounter: Payer: Self-pay | Admitting: Cardiovascular Disease

## 2017-08-04 ENCOUNTER — Ambulatory Visit (INDEPENDENT_AMBULATORY_CARE_PROVIDER_SITE_OTHER): Payer: BC Managed Care – PPO | Admitting: Cardiovascular Disease

## 2017-08-04 VITALS — BP 132/90 | HR 78 | Ht 65.0 in | Wt 147.5 lb

## 2017-08-04 DIAGNOSIS — I10 Essential (primary) hypertension: Secondary | ICD-10-CM

## 2017-08-04 DIAGNOSIS — F339 Major depressive disorder, recurrent, unspecified: Secondary | ICD-10-CM

## 2017-08-04 DIAGNOSIS — R079 Chest pain, unspecified: Secondary | ICD-10-CM

## 2017-08-04 DIAGNOSIS — Z9884 Bariatric surgery status: Secondary | ICD-10-CM

## 2017-08-04 DIAGNOSIS — R002 Palpitations: Secondary | ICD-10-CM

## 2017-08-04 MED ORDER — METOPROLOL SUCCINATE ER 25 MG PO TB24: 25.0000 mg | ORAL_TABLET | Freq: Every day | ORAL | 11 refills | Status: DC

## 2017-08-04 NOTE — Patient Instructions (Signed)
Medication Instructions:   Please try the metoprolol 1/2 pill up to whole pill either daily or as needed for palpitations  Call if you would like a event monitor  14 day and 30 day  Labwork:  No new labs needed  Testing/Procedures:  No further testing at this time   Follow-Up: It was a pleasure seeing you in the office today. Please call us if you have new issues that need to be addressed before your next appt.  [phone]  Your physician wants you to follow-up in:  As needed  If you need a refill on your cardiac medications before your next appointment, please call your pharmacy.

## 2017-08-14 ENCOUNTER — Ambulatory Visit: Payer: BC Managed Care – PPO | Admitting: Internal Medicine

## 2017-09-26 ENCOUNTER — Other Ambulatory Visit: Payer: Self-pay | Admitting: Family Medicine

## 2017-10-01 ENCOUNTER — Ambulatory Visit (INDEPENDENT_AMBULATORY_CARE_PROVIDER_SITE_OTHER): Payer: BC Managed Care – PPO | Admitting: Internal Medicine

## 2017-10-01 ENCOUNTER — Ambulatory Visit (INDEPENDENT_AMBULATORY_CARE_PROVIDER_SITE_OTHER): Payer: BC Managed Care – PPO

## 2017-10-01 ENCOUNTER — Encounter: Payer: Self-pay | Admitting: Internal Medicine

## 2017-10-01 VITALS — BP 132/88 | HR 81 | Temp 98.9°F | Resp 14 | Ht 65.0 in | Wt 149.8 lb

## 2017-10-01 DIAGNOSIS — S4991XD Unspecified injury of right shoulder and upper arm, subsequent encounter: Secondary | ICD-10-CM

## 2017-10-01 DIAGNOSIS — R7301 Impaired fasting glucose: Secondary | ICD-10-CM

## 2017-10-01 DIAGNOSIS — M25562 Pain in left knee: Secondary | ICD-10-CM

## 2017-10-01 DIAGNOSIS — F5101 Primary insomnia: Secondary | ICD-10-CM | POA: Diagnosis not present

## 2017-10-01 DIAGNOSIS — E559 Vitamin D deficiency, unspecified: Secondary | ICD-10-CM | POA: Diagnosis not present

## 2017-10-01 DIAGNOSIS — M25561 Pain in right knee: Secondary | ICD-10-CM

## 2017-10-01 DIAGNOSIS — M25511 Pain in right shoulder: Secondary | ICD-10-CM | POA: Diagnosis not present

## 2017-10-01 DIAGNOSIS — Z23 Encounter for immunization: Secondary | ICD-10-CM

## 2017-10-01 DIAGNOSIS — G8929 Other chronic pain: Secondary | ICD-10-CM

## 2017-10-01 DIAGNOSIS — E441 Mild protein-calorie malnutrition: Secondary | ICD-10-CM

## 2017-10-01 DIAGNOSIS — R079 Chest pain, unspecified: Secondary | ICD-10-CM | POA: Diagnosis not present

## 2017-10-01 DIAGNOSIS — I10 Essential (primary) hypertension: Secondary | ICD-10-CM | POA: Diagnosis not present

## 2017-10-01 MED ORDER — VITAMIN D (ERGOCALCIFEROL) 1.25 MG (50000 UNIT) PO CAPS: 50000.0000 [IU] | ORAL_CAPSULE | ORAL | 0 refills | Status: DC

## 2017-10-01 MED ORDER — TRAMADOL HCL 50 MG PO TABS: 50.0000 mg | ORAL_TABLET | Freq: Two times a day (BID) | ORAL | 3 refills | Status: DC | PRN

## 2017-10-01 MED ORDER — TRAZODONE HCL 50 MG PO TABS: 25.0000 mg | ORAL_TABLET | Freq: Every evening | ORAL | 3 refills | Status: DC | PRN

## 2017-10-01 NOTE — Progress Notes (Signed)
Subjective:  Patient ID: Kelly [name], female    DOB: 1966-01-19  Age: 51 y.o. MRN: [id number]  CC: The primary encounter diagnosis was Chronic pain of right knee. Diagnoses of Need for immunization against influenza, Injury of right shoulder, subsequent encounter, Chronic right shoulder pain, Vitamin D deficiency, Mild protein-calorie malnutrition (HCC), Impaired fasting glucose, Essential hypertension, Chest pain at rest, Chronic pain of both knees, and Primary insomnia were also pertinent to this visit.  HPI Kelly [name] presents for follow up on hypertension,  IPG with normal a1c,  History of obesity s/p bariatric surgery with 100 lb weight loss thus far.   Patient has 2 internists by choice.  Was referred to cardiology for chest pain/palpitations after  ER evaluation was negative for ischemia.  Considered low risk for ischemia despite FH of early CAD , metoprolol and cardiac monitor offered.  Obesity/malnutrition: weight loss s/p bariatric surgery continued and she lost 12 lbs since last visit , increased portein intake was advised.   States that she reached 135 lbs on home scales but felt it was too low.  has gained 14 lbs, now at 149 on home scales. Wants to stay at this weight.      JY:NWGN.   1)  right shoulder pain radiates to tricep and deltoid  Hs been present for several months .  Started with a feeling of numbness , followed by feeling like she had pulled a muscle.  2)  Also reporting pain in bilateral knees described as aching. No giving way.     Lab Results  Component Value Date   CHOL 181 07/23/2017   HDL 93 07/23/2017   LDLCALC 76 07/23/2017   TRIG 61 07/23/2017   CHOLHDL 1.9 07/23/2017     Outpatient Medications Prior to Visit  Medication Sig Dispense Refill  . Cyanocobalamin (VITAMIN B-12) 1000 MCG SUBL Place 1 tablet (1,000 mcg total) under the tongue daily. 30 tablet 11  . metoprolol succinate (TOPROL-XL) 25 MG 24 hr tablet Take 1 tablet (25 mg total)  by mouth daily. Take with or immediately following a meal. 30 tablet 11  . Multiple Vitamin (MULTIVITAMIN) capsule Take 1 capsule by mouth daily.    . pseudoephedrine-acetaminophen (TYLENOL SINUS) 30-500 MG TABS tablet Take 2 tablets by mouth every 4 (four) hours as needed.    . Vitamin D, Ergocalciferol, (DRISDOL) 50000 units CAPS capsule Take 1 capsule (50,000 Units total) by mouth every 30 (thirty) days. 1 capsule 2   No facility-administered medications prior to visit.     Review of Systems;  Patient denies headache, fevers, malaise, unintentional weight loss, skin rash, eye pain, sinus congestion and sinus pain, sore throat, dysphagia,  hemoptysis , cough, dyspnea, wheezing, chest pain, palpitations, orthopnea, edema, abdominal pain, nausea, melena, diarrhea, constipation, flank pain, dysuria, hematuria, urinary  Frequency, nocturia, numbness, tingling, seizures,  Focal weakness, Loss of consciousness,  Tremor, insomnia, depression, anxiety, and suicidal ideation.      Objective:  BP 132/88 (BP Location: Left Arm, Patient Position: Sitting, Cuff Size: Normal)   Pulse 81   Temp 98.9 F (37.2 C) (Oral)   Resp 14   Ht  (1.651 m)   Wt 149 lb 12.8 oz (67.9 kg)   SpO2 100%   BMI 24.93 kg/m   BP Readings from Last 3 Encounters:  10/01/17 132/88  08/04/17 132/90  07/23/17 (!) 104/58    Wt Readings from Last 3 Encounters:  10/01/17 149 lb 12.8 oz (67.9 kg)  08/04/17  147 lb 8 oz (66.9 kg)  07/23/17 144 lb 14.4 oz (65.7 kg)    General appearance: alert, cooperative and appears stated age Ears: normal TM's and external ear canals both ears Throat: lips, mucosa, and tongue normal; teeth and gums normal Neck: no adenopathy, no carotid bruit, supple, symmetrical, trachea midline and thyroid not enlarged, symmetric, no tenderness/mass/nodules Back: symmetric, no curvature. ROM normal. No CVA tenderness. Lungs: clear to auscultation bilaterally Heart: regular rate and rhythm, S1,  S2 normal, no murmur, click, rub or gallop Abdomen: soft, non-tender; bowel sounds normal; no masses,  no organomegaly Pulses: 2+ and symmetric Skin: Skin color, texture, turgor normal. No rashes or lesions Lymph nodes: Cervical, supraclavicular, and axillary nodes normal.  Lab Results  Component Value Date   HGBA1C 5.2 07/23/2017   HGBA1C 5.7 06/14/2015   HGBA1C 5.8 09/13/2012    Lab Results  Component Value Date   CREATININE 0.61 07/23/2017   CREATININE 0.60 02/01/2017   CREATININE 0.65 10/09/2016    Lab Results  Component Value Date   WBC 5.7 07/23/2017   HGB 12.8 07/23/2017   HCT 38.2 07/23/2017   PLT 197 07/23/2017   GLUCOSE 95 07/23/2017   CHOL 181 07/23/2017   TRIG 61 07/23/2017   HDL 93 07/23/2017   LDLCALC 76 07/23/2017   ALT 15 07/23/2017   AST 15 07/23/2017   NA 141 07/23/2017   K 3.6 07/23/2017   CL 105 07/23/2017   CREATININE 0.61 07/23/2017   BUN 9 07/23/2017   CO2 27 07/23/2017   TSH 1.27 07/23/2017   HGBA1C 5.2 07/23/2017   MICROALBUR <0.7 06/14/2015    No results found.  Assessment & Plan:   Problem List Items Addressed This Visit    Chest pain at rest    Noncardiac. .Cardiology evaluation reassuring.  Tolerating metoprolol       Essential hypertension    BP Readings from Last 3 Encounters:  10/01/17 132/88  08/04/17 132/90  07/23/17 (!) 104/58   No longer taking Losartan 50 mg , due to initiation of metoprolol in August by cardiology  for palpitations.  Will follow.       Impaired fasting glucose    a1c remains in normal range.   Lab Results  Component Value Date   HGBA1C 5.2 07/23/2017         Insomnia    Trial of trazodone       Knee pain, bilateral    With prior meniscal teary by MRI..  referral to orthopedics for eval and treatment.       Protein-calorie malnutrition (HCC)    Resolved with increased protein and fat intake while monitoring her carbohydrates.       Shoulder pain, right    Plain films normal.   Likely a rotator cuff injury vs brachia plexus injury.  Tramadol prn.  Orthopedics referral.  Avoid NSAIDs due to bariatric surgery       Relevant Orders   DG Shoulder Right (Completed)   Vitamin D deficiency    Secondary to gastric bypass  level was 17 in march via EPIC portal by outside lab   And still < 20 in August.  She was advised to increase Drisdol form monthly to weekly         Other Visit Diagnoses    Chronic pain of right knee    -  Primary   Relevant Orders   DG Knee Complete 4 Views Right (Completed)   Need for immunization against influenza  Relevant Orders   Flu Vaccine QUAD 36+ mos IM (Completed)   Injury of right shoulder, subsequent encounter          I have changed Ms. Gundrum's Vitamin D (Ergocalciferol). I am also having her start on traZODone and traMADol. Additionally, I am having her maintain her pseudoephedrine-acetaminophen, Vitamin B-12, multivitamin, and metoprolol succinate.  Meds ordered this encounter  Medications  . Vitamin D, Ergocalciferol, (DRISDOL) 50000 units CAPS capsule    Sig: Take 1 capsule (50,000 Units total) by mouth every 7 (seven) days. For one month ,  Then monthly thereafter    Dispense:  12 capsule    Refill:  0  . traZODone (DESYREL) 50 MG tablet    Sig: Take 0.5-1 tablets (25-50 mg total) by mouth at bedtime as needed for sleep.    Dispense:  30 tablet    Refill:  3  . traMADol (ULTRAM) 50 MG tablet    Sig: Take 1 tablet (50 mg total) by mouth every 12 (twelve) hours as needed.    Dispense:  60 tablet    Refill:  3    Medications Discontinued During This Encounter  Medication Reason  . Vitamin D, Ergocalciferol, (DRISDOL) 50000 units CAPS capsule Reorder    Follow-up: No Follow-up on file.   [name], MD

## 2017-10-01 NOTE — Patient Instructions (Addendum)
I have ordered plain films of right shoulder and right knee  You can use tramadol every 6 hours as needed  You will likely need steroid injections to manage pain from degenerative joint disease ( we iwl lmake a sports medicine for the shoulder if need be )    Take your vitamin D weeklyfor one month,    Then resume a monthly dose ,     For the insomnia:  Trial of trazodone.  Start with 1/2 tablet by 9 pm.

## 2017-10-02 ENCOUNTER — Encounter: Payer: Self-pay | Admitting: Internal Medicine

## 2017-10-02 DIAGNOSIS — M25511 Pain in right shoulder: Principal | ICD-10-CM

## 2017-10-02 DIAGNOSIS — M25561 Pain in right knee: Secondary | ICD-10-CM

## 2017-10-02 DIAGNOSIS — G8929 Other chronic pain: Secondary | ICD-10-CM

## 2017-10-03 ENCOUNTER — Encounter: Payer: Self-pay | Admitting: Internal Medicine

## 2017-10-03 DIAGNOSIS — M25562 Pain in left knee: Secondary | ICD-10-CM

## 2017-10-03 DIAGNOSIS — M25561 Pain in right knee: Secondary | ICD-10-CM | POA: Insufficient documentation

## 2017-10-03 DIAGNOSIS — G4700 Insomnia, unspecified: Secondary | ICD-10-CM | POA: Insufficient documentation

## 2017-10-03 NOTE — Assessment & Plan Note (Addendum)
BP Readings from Last 3 Encounters:  10/01/17 132/88  08/04/17 132/90  07/23/17 (!) 104/58   No longer taking Losartan 50 mg , due to initiation of metoprolol in August by cardiology  for palpitations.  Will follow.

## 2017-10-03 NOTE — Assessment & Plan Note (Signed)
a1c remains in normal range.   Lab Results  Component Value Date   HGBA1C 5.2 07/23/2017

## 2017-10-03 NOTE — Assessment & Plan Note (Signed)
With prior meniscal teary by MRI..  referral to orthopedics for eval and treatment.

## 2017-10-03 NOTE — Assessment & Plan Note (Signed)
Trial of trazodone. 

## 2017-10-03 NOTE — Assessment & Plan Note (Signed)
Resolved with increased protein and fat intake while monitoring her carbohydrates.

## 2017-10-03 NOTE — Assessment & Plan Note (Addendum)
Secondary to gastric bypass  level was 17 in march via EPIC portal by outside lab   And still < 20 in August.  She was advised to increase Drisdol form monthly to weekly

## 2017-10-03 NOTE — Assessment & Plan Note (Addendum)
Noncardiac. .Cardiology evaluation reassuring.  Tolerating metoprolol

## 2017-10-03 NOTE — Assessment & Plan Note (Signed)
Plain films normal.  Likely a rotator cuff injury vs brachia plexus injury.  Tramadol prn.  Orthopedics referral.  Avoid NSAIDs due to bariatric surgery

## 2017-10-15 ENCOUNTER — Telehealth: Payer: Self-pay | Admitting: Internal Medicine

## 2017-10-15 NOTE — Telephone Encounter (Signed)
Pt called requesting a copy of her x-rays of rt shoulder and rt knee. Please advise, thank you!  Call pt @ [phone]

## 2017-10-16 DIAGNOSIS — M179 Osteoarthritis of knee, unspecified: Secondary | ICD-10-CM | POA: Insufficient documentation

## 2017-10-16 DIAGNOSIS — M7540 Impingement syndrome of unspecified shoulder: Secondary | ICD-10-CM | POA: Insufficient documentation

## 2017-10-16 DIAGNOSIS — M1710 Unilateral primary osteoarthritis, unspecified knee: Secondary | ICD-10-CM | POA: Insufficient documentation

## 2017-10-16 NOTE — Telephone Encounter (Signed)
Called pt to notify Xray CD is ready for her to pick up. CD placed in folder in cabinet up front.

## 2017-11-02 ENCOUNTER — Other Ambulatory Visit: Payer: Self-pay

## 2017-11-02 MED ORDER — TRAZODONE HCL 50 MG PO TABS: 25.0000 mg | ORAL_TABLET | Freq: Every evening | ORAL | 0 refills | Status: DC | PRN

## 2017-12-09 ENCOUNTER — Telehealth: Payer: BC Managed Care – PPO | Admitting: Family

## 2017-12-09 DIAGNOSIS — R6889 Other general symptoms and signs: Secondary | ICD-10-CM

## 2017-12-09 DIAGNOSIS — R0602 Shortness of breath: Secondary | ICD-10-CM

## 2017-12-09 NOTE — Progress Notes (Signed)
Based on what you shared with me it looks like you have a serious condition that should be evaluated in a face to face office visit.  NOTE: Even if you have entered your credit card information for this eVisit, you will not be charged.   If you are having a true medical emergency please call 911.  If you need an urgent face to face visit, [place] has four urgent care centers for your convenience.  If you need care fast and have a high deductible or no insurance consider:   [email or web address]  [phone]  [address], [address] [place], [place] [address] 8 am to 8 pm Monday-Friday 10 am to 4 pm Saturday-Sunday   The following sites will take your  insurance:    . [facility]  [phone] Get Driving Directions Find a Provider at this Location  [address] [place], [place] [address] . 10 am to 8 pm Monday-Friday . 12 pm to 8 pm Saturday-Sunday   . [facility]  [phone] Get Driving Directions Find a Provider at this Location  [address], [address] , [place] [address] . 8 am to 8 pm Monday-Friday . 9 am to 6 pm Saturday . 11 am to 6 pm Sunday   . [facility] [place]  [phone] Get Driving Directions  [address] [place], [place] [address] . 8 am to 8 pm Monday-Friday . 8 am to 4 pm Saturday-Sunday   Your e-visit answers were reviewed by a board certified advanced clinical practitioner to complete your personal care plan.  Thank you for using e-Visits.  

## 2017-12-27 ENCOUNTER — Other Ambulatory Visit: Payer: Self-pay | Admitting: Family Medicine

## 2017-12-28 NOTE — Telephone Encounter (Signed)
Patient is seeing another primary care provider now

## 2018-01-25 ENCOUNTER — Ambulatory Visit: Payer: BC Managed Care – PPO | Admitting: Family Medicine

## 2018-01-29 ENCOUNTER — Ambulatory Visit: Payer: BC Managed Care – PPO | Admitting: Family Medicine

## 2018-02-04 ENCOUNTER — Ambulatory Visit: Payer: BC Managed Care – PPO | Admitting: Internal Medicine

## 2018-02-25 ENCOUNTER — Other Ambulatory Visit: Payer: Self-pay | Admitting: Internal Medicine

## 2018-04-14 IMAGING — CT CT ANGIO CHEST
2 of 6 series · 18 of 46 positions shown · IV contrast (APPLIED)
Comparison: None.

CLINICAL DATA: Acute onset of left-sided chest pain and shortness
of breath. Initial encounter.

EXAM:
CT ANGIOGRAPHY CHEST WITH CONTRAST
TECHNIQUE: Multidetector CT imaging of the chest was performed using the
standard protocol during bolus administration of intravenous
contrast. Multiplanar CT image reconstructions and MIPs were
obtained to evaluate the vascular anatomy.
CONTRAST:  100 mL of Omnipaque 300 IV contrast

[Series 5: thins · axial · 0.71mm/px · z∈[-439,-192]mm · 16 of 271 slices shown]
[im 12/271  lung]
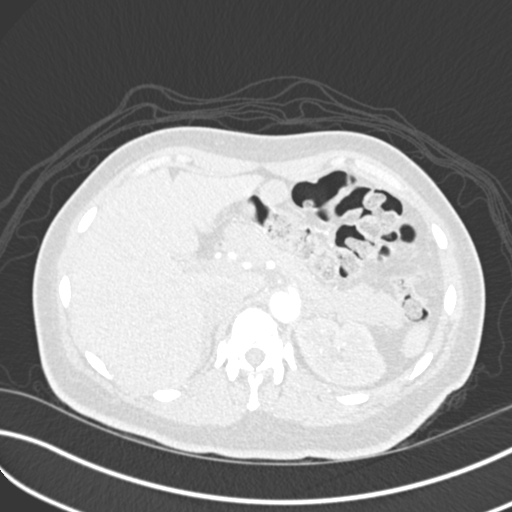
[im 36/271  soft-tissue]
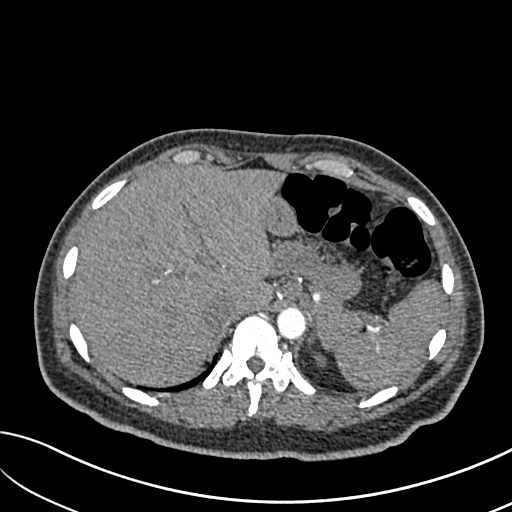
[im 47/271  lung]
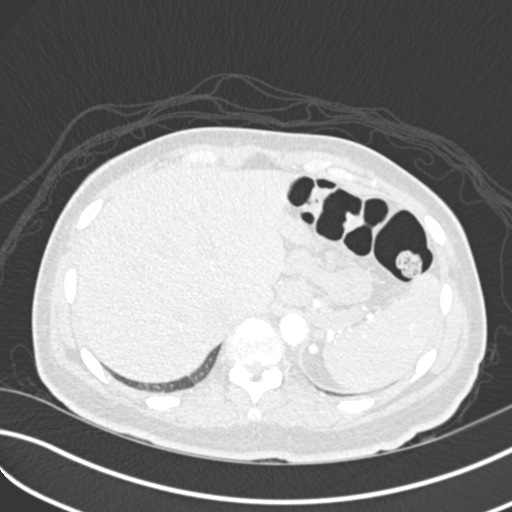
[im 59/271  soft-tissue]
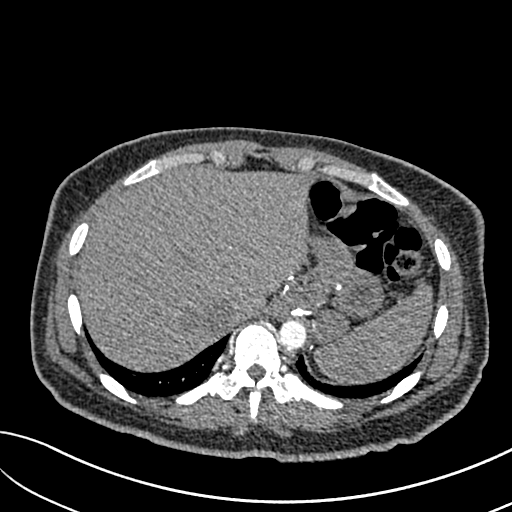
[im 83/271  lung]
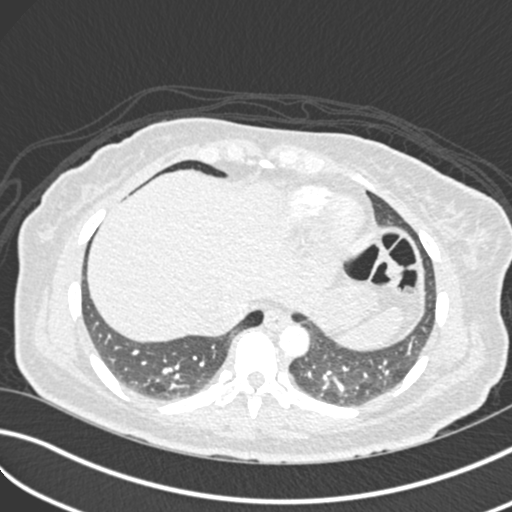
[im 94/271  soft-tissue]
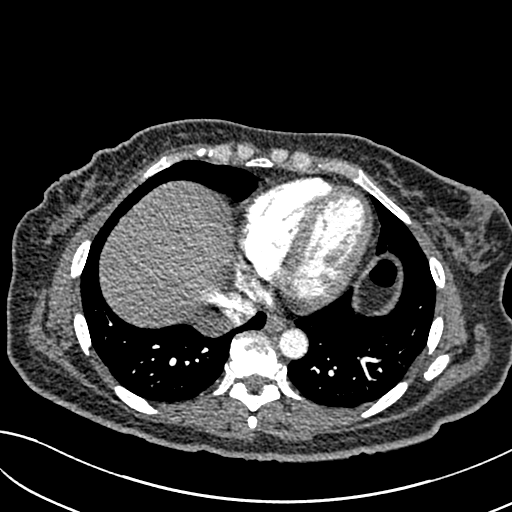
[im 106/271  lung]
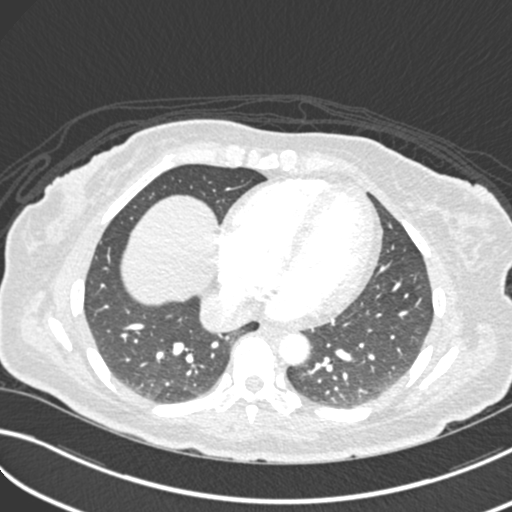
[im 130/271  soft-tissue]
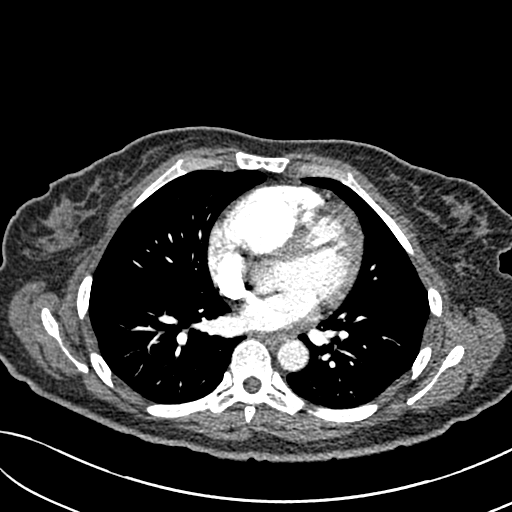
[im 141/271  lung]
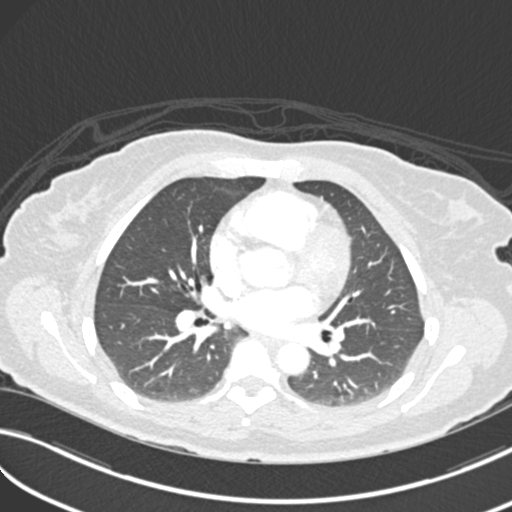
[im 165/271  soft-tissue]
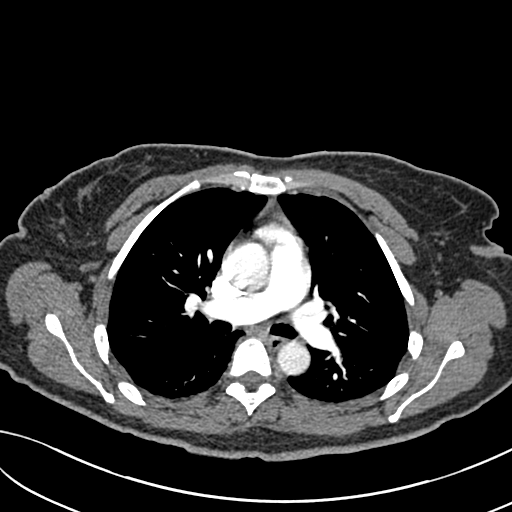
[im 177/271  lung]
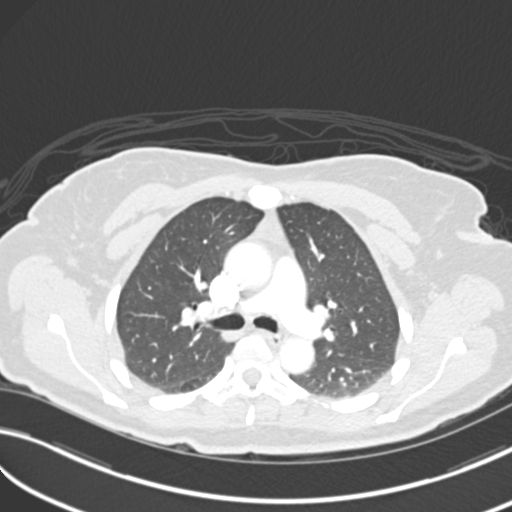
[im 188/271  soft-tissue]
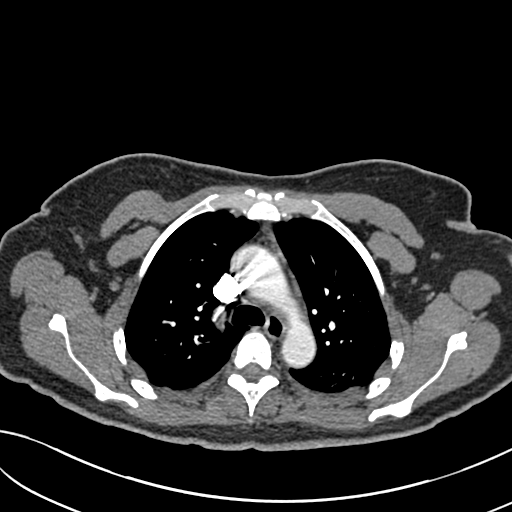
[im 212/271  lung]
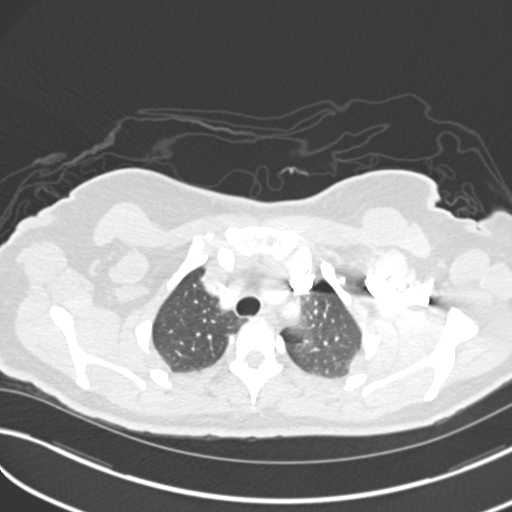
[im 224/271  soft-tissue]
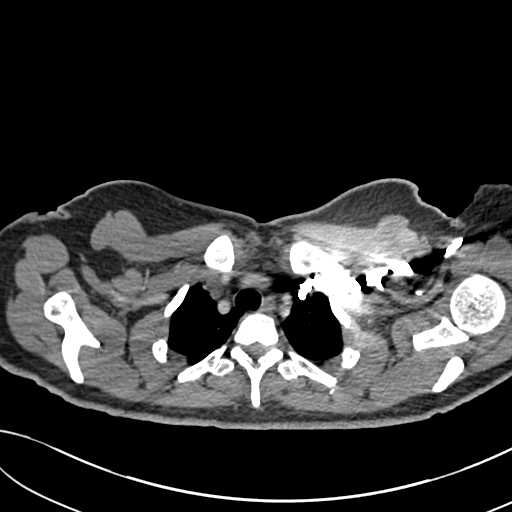
[im 235/271  lung]
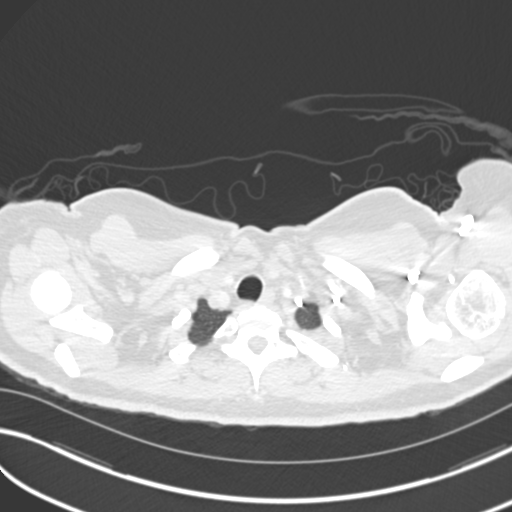
[im 259/271  soft-tissue]
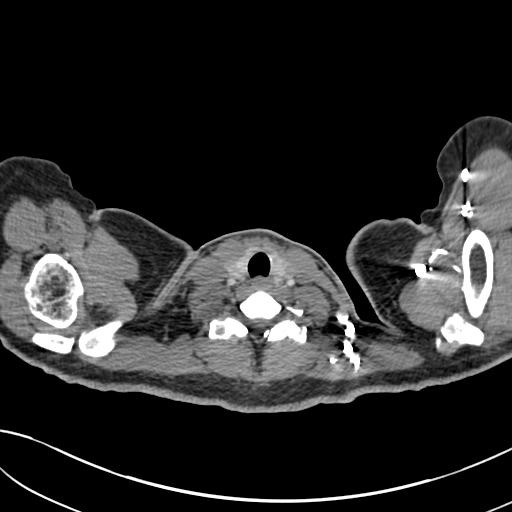

[Series 7: coronal mpr · coronal · 0.53mm/px · 2 of 79 slices shown]
[im 27/79  soft-tissue]
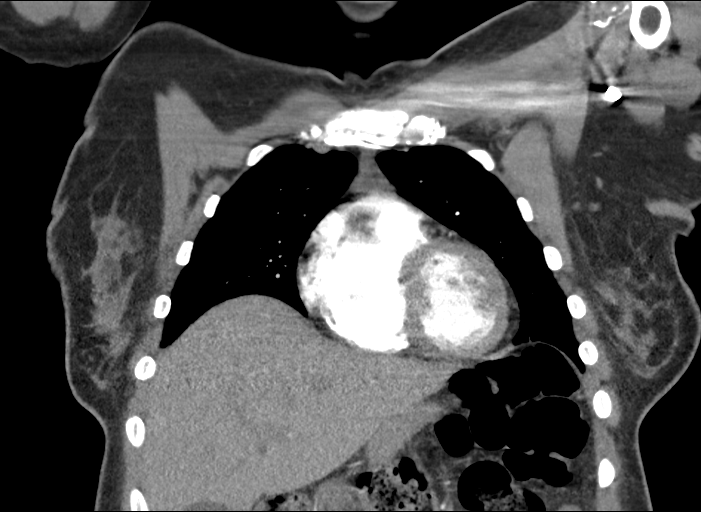
[im 53/79  soft-tissue]
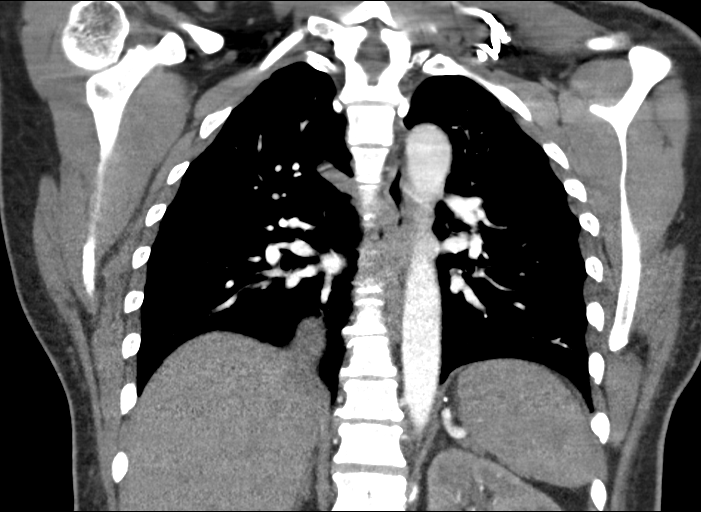

[18 of 46 positions shown; findings below may reference images not displayed]

FINDINGS: Cardiovascular: The heart is normal in size. The thoracic aorta is
grossly unremarkable. No calcific atherosclerotic disease is seen.
The great vessels are within normal limits.

Mediastinum/Nodes: The mediastinum is unremarkable in appearance. No
mediastinal lymphadenopathy is seen. No pericardial effusion is
identified. Scattered small hypodensities within the thyroid gland
are likely benign, given their size. No axillary lymphadenopathy is
seen.

Lungs/Pleura: Minimal bibasilar atelectasis is noted. The lungs are
otherwise clear. No pleural effusion or pneumothorax is seen. No
masses are identified.

Upper Abdomen: The visualized portions of the liver and spleen are
grossly unremarkable. The visualized portions of gallbladder,
pancreas and adrenal glands are unremarkable. The visualized
portions of the left kidney are within normal limits. The patient is
status post gastric bypass.

Musculoskeletal: No acute osseous abnormalities are identified. The
visualized musculature is unremarkable in appearance.

Review of the MIP images confirms the above findings.
IMPRESSION: Unremarkable contrast-enhanced CT of the chest.

## 2018-04-24 ENCOUNTER — Other Ambulatory Visit: Payer: Self-pay | Admitting: Cardiovascular Disease

## 2018-05-22 ENCOUNTER — Other Ambulatory Visit: Payer: Self-pay | Admitting: Internal Medicine

## 2018-05-26 ENCOUNTER — Other Ambulatory Visit: Payer: Self-pay | Admitting: Internal Medicine

## 2018-08-28 ENCOUNTER — Other Ambulatory Visit: Payer: Self-pay | Admitting: Internal Medicine

## 2018-10-27 ENCOUNTER — Other Ambulatory Visit: Payer: Self-pay | Admitting: Neurosurgery

## 2018-10-27 DIAGNOSIS — M5126 Other intervertebral disc displacement, lumbar region: Secondary | ICD-10-CM

## 2018-11-10 ENCOUNTER — Ambulatory Visit
Admission: RE | Admit: 2018-11-10 | Discharge: 2018-11-10 | Disposition: A | Discharge status: Home / Self Care | Payer: BC Managed Care – PPO | Source: Ambulatory Visit | Attending: Neurosurgery | Admitting: Neurosurgery

## 2018-11-10 DIAGNOSIS — M5126 Other intervertebral disc displacement, lumbar region: Secondary | ICD-10-CM

## 2018-11-10 MED ORDER — GADOBENATE DIMEGLUMINE 529 MG/ML IV SOLN
15.0000 mL | Freq: Once | INTRAVENOUS | Status: AC | PRN
  Administered 2018-11-10: 15 mL via INTRAVENOUS

## 2018-12-17 ENCOUNTER — Encounter: Payer: BC Managed Care – PPO | Admitting: Internal Medicine

## 2019-01-04 DIAGNOSIS — M79641 Pain in right hand: Secondary | ICD-10-CM | POA: Insufficient documentation

## 2019-01-04 DIAGNOSIS — M79642 Pain in left hand: Secondary | ICD-10-CM

## 2019-02-07 ENCOUNTER — Ambulatory Visit (INDEPENDENT_AMBULATORY_CARE_PROVIDER_SITE_OTHER): Payer: BC Managed Care – PPO

## 2019-02-07 ENCOUNTER — Ambulatory Visit: Payer: BC Managed Care – PPO | Admitting: Podiatry

## 2019-02-07 ENCOUNTER — Encounter: Payer: Self-pay | Admitting: Podiatry

## 2019-02-07 VITALS — BP 126/89 | HR 85 | Temp 98.6°F

## 2019-02-07 DIAGNOSIS — M722 Plantar fascial fibromatosis: Secondary | ICD-10-CM

## 2019-02-07 DIAGNOSIS — M2580 Other specified joint disorders, unspecified joint: Secondary | ICD-10-CM

## 2019-02-07 MED ORDER — METHYLPREDNISOLONE 4 MG PO TBPK: ORAL_TABLET | ORAL | 0 refills | Status: DC

## 2019-02-07 MED ORDER — MELOXICAM 15 MG PO TABS: 15.0000 mg | ORAL_TABLET | Freq: Every day | ORAL | 3 refills | Status: DC

## 2019-02-07 NOTE — Patient Instructions (Signed)

## 2019-02-07 NOTE — Progress Notes (Signed)
Subjective:  Patient ID: Kelly [name], female    DOB: [birth year],  MRN: [id number] HPI Chief Complaint  Patient presents with  . Foot Pain    Patient presents today for bilat heel pain x 8 months and left hallux 1st mpj pain x 2 months.  She reports her heels are the worst in the mornings and feels like pins poking her feet.  She states "my toe gets stiff and swells alot and is very painful to walk at times"  She only takes Tylenol for pain    53 y.o. female presents with the above complaint.   ROS: Denies fever chills nausea vomiting muscle aches pains calf pain back pain chest pain shortness of breath.  Past Medical History:  Diagnosis Date  . Chest pain 2002   abnormal EKG, negative treadmill stress test/Holter myoview, Dr. Welton Flakes   Past Surgical History:  Procedure Laterality Date  . ABDOMINAL HYSTERECTOMY  35   secondary to post partum hemorrhage causing anemia  . GASTRIC BYPASS  2016  . SPINE SURGERY     lumbar spine, L4-L5 ruptured disc    Current Outpatient Medications:  .  Cyanocobalamin (VITAMIN B-12) 1000 MCG SUBL, Place 1 tablet (1,000 mcg total) under the tongue daily., Disp: 30 tablet, Rfl: 11 .  diclofenac sodium (VOLTAREN) 1 % GEL, APP 2 GRAMS EXT AA QID, Disp: , Rfl:  .  meloxicam (MOBIC) 15 MG tablet, Take 1 tablet (15 mg total) by mouth daily., Disp: 30 tablet, Rfl: 3 .  methylPREDNISolone (MEDROL DOSEPAK) 4 MG TBPK tablet, 6 day dose pack - take as directed, Disp: 21 tablet, Rfl: 0 .  Multiple Vitamin (MULTIVITAMIN) capsule, Take 1 capsule by mouth daily., Disp: , Rfl:  .  Vitamin D, Ergocalciferol, (DRISDOL) 50000 units CAPS capsule, TAKE 1 CAPSULE BY MOUTH EVERY 7 DAYS FOR 1 MONTH. THEN MONTHLY THEREAFTER, Disp: 6 capsule, Rfl: 0  Allergies  Allergen Reactions  . Latex Hives and Itching  . Codeine Itching   Review of Systems Objective:   Vitals:   02/07/19 1020  BP: 126/89  Pulse: 85  Temp: 98.6 F (37 C)    General: Well developed,  nourished, in no acute distress, alert and oriented x3   Dermatological: Skin is warm, dry and supple bilateral. Nails x 10 are well maintained; remaining integument appears unremarkable at this time. There are no open sores, no preulcerative lesions, no rash or signs of infection present.  Vascular: Dorsalis Pedis artery and Posterior Tibial artery pedal pulses are 2/4 bilateral with immedate capillary fill time. Pedal hair growth present. No varicosities and no lower extremity edema present bilateral.   Neruologic: Grossly intact via light touch bilateral. Vibratory intact via tuning fork bilateral. Protective threshold with Semmes Wienstein monofilament intact to all pedal sites bilateral. Patellar and Achilles deep tendon reflexes 2+ bilateral. No Babinski or clonus noted bilateral.   Musculoskeletal: No gross boney pedal deformities bilateral. No pain, crepitus, or limitation noted with foot and ankle range of motion bilateral. Muscular strength 5/5 in all groups tested bilateral.  She has pain on palpation medial calcaneal tubercles bilateral.  She also has pain on palpation to the tibial sesamoid of the left first metatarsal phalangeal joint. Gait: Unassisted, Nonantalgic.    Radiographs:  Radiographs taken today demonstrate an osseously mature individual no fractures are identified.  Soft tissue increase in density retrocalcaneal insertion site is minimal no acute findings.  Assessment & Plan:   Assessment: Sesamoiditis left tibial sesamoid.  Plantar fasciitis bilateral.  Plan: Discussed etiology pathology and surgical therapies at this point time injected the tibial sesamoid with dexamethasone local anesthetic just to the proximal tendon area of the sesamoid left.  This was performed with 2 mg of dexamethasone and local anesthetic to the point of maximal tenderness.  Also injected the bilateral heels 20 mg Kenalog 5 mg Marcaine point maximal tenderness bilateral.  Discussed  appropriate shoe gear stretching exercise ice therapy and sugar modifications placed in bilateral plantar fascial braces started on Medrol Dosepak to be followed by meloxicam.  Because of her bypass she will only take this every 5 days and then discontinue for 5 days      T. [place], [place]

## 2019-02-23 ENCOUNTER — Ambulatory Visit
Admission: EM | Admit: 2019-02-23 | Discharge: 2019-02-23 | Disposition: A | Discharge status: Home / Self Care | Payer: BC Managed Care – PPO | Attending: Family Medicine | Admitting: Family Medicine

## 2019-02-23 ENCOUNTER — Other Ambulatory Visit: Payer: Self-pay

## 2019-02-23 DIAGNOSIS — R69 Illness, unspecified: Secondary | ICD-10-CM | POA: Diagnosis not present

## 2019-02-23 DIAGNOSIS — J029 Acute pharyngitis, unspecified: Secondary | ICD-10-CM

## 2019-02-23 DIAGNOSIS — H9203 Otalgia, bilateral: Secondary | ICD-10-CM | POA: Diagnosis not present

## 2019-02-23 DIAGNOSIS — J111 Influenza due to unidentified influenza virus with other respiratory manifestations: Secondary | ICD-10-CM

## 2019-02-23 DIAGNOSIS — R509 Fever, unspecified: Secondary | ICD-10-CM

## 2019-02-23 LAB — RAPID INFLUENZA A&B ANTIGENS
Influenza A (ARMC): NEGATIVE
Influenza B (ARMC): NEGATIVE

## 2019-02-23 LAB — RAPID STREP SCREEN (MED CTR MEBANE ONLY): Streptococcus, Group A Screen (Direct): NEGATIVE

## 2019-02-23 MED ORDER — OSELTAMIVIR PHOSPHATE 75 MG PO CAPS: 75.0000 mg | ORAL_CAPSULE | Freq: Two times a day (BID) | ORAL | 0 refills | Status: DC

## 2019-02-23 NOTE — ED Triage Notes (Signed)
Pt with bilateral ear pain, sore throat, low grade fever. Pain 6/10

## 2019-02-23 NOTE — ED Provider Notes (Signed)
[facility]    CSN: [id number] Arrival date & time: 02/23/19  0910  History   Chief Complaint Chief Complaint  Patient presents with  . Otalgia  . Sore Throat   HPI  53 year old female presents with ear pain, sore throat, fever, chills.  Symptom started on Monday.  Patient reports bilateral ear pain, sore throat, fever (T-max 100.5).  Associated chills and body aches.  No reported sick contacts.  No known exacerbating relieving factors.  Pain is currently 6-10 in severity.  No other associated symptoms.  No other complaints.  PMH: Patient Active Problem List   Diagnosis Date Noted  . Bilateral hand pain 01/04/2019  . Impingement syndrome of shoulder region 10/16/2017  . Osteoarthritis of knee 10/16/2017  . Knee pain, bilateral 10/03/2017  . Insomnia 10/03/2017  . Chest pain at rest 07/23/2017  . Protein-calorie malnutrition (HCC) 11/06/2016  . Vitamin B12 deficiency due to intestinal malabsorption 11/06/2016  . Vitamin D deficiency 11/06/2016  . S/P bariatric surgery 11/06/2016  . Abdominal pain, RUQ 03/25/2016  . Postoperative malabsorption 03/04/2016  . Back pain 01/16/2016  . Impaired fasting glucose 06/18/2015  . GERD (gastroesophageal reflux disease) 06/17/2015  . Lumbar spine strain, initial encounter 05/30/2015  . Spondylosis of lumbar region without myelopathy or radiculopathy 05/30/2015  . Essential hypertension 10/21/2014  . Abnormal involuntary movements 10/21/2014  . Aural vertigo 10/18/2014  . Visit for preventive health examination 09/19/2014  . S/P hysterectomy with oophorectomy 09/19/2014  . Absence of both cervix and uterus, acquired 09/19/2014  . Generalized anxiety disorder 09/18/2014  . Snoring 09/18/2014  . Abnormal respiratory rate 09/18/2014  . Irritable bowel syndrome 11/11/2013  . Shoulder pain, right 11/11/2013  . Orthostatic hypotension 03/14/2013  . Menopause ovarian failure 09/13/2012  . Major depressive disorder, recurrent  episode with anxious distress (HCC) 07/27/2012  . Migraine headache without aura 07/27/2012  . Other chronic allergic conjunctivitis 07/27/2012  . Sciatica of left side 01/08/2012    Past Surgical History:  Procedure Laterality Date  . ABDOMINAL HYSTERECTOMY  35   secondary to post partum hemorrhage causing anemia  . GASTRIC BYPASS  2016  . SPINE SURGERY     lumbar spine, L4-L5 ruptured disc    OB History   No obstetric history on file.      Home Medications    Prior to Admission medications   Medication Sig Start Date End Date Taking? Authorizing Provider  Cyanocobalamin (VITAMIN B-12) 1000 MCG SUBL Place 1 tablet (1,000 mcg total) under the tongue daily. 07/23/17   Kerman Passey, MD  diclofenac sodium (VOLTAREN) 1 % GEL APP 2 GRAMS EXT AA QID 01/05/19   [provider]  meloxicam (MOBIC) 15 MG tablet Take 1 tablet (15 mg total) by mouth daily. 02/07/19   Hyatt, Max T, DPM  Multiple Vitamin (MULTIVITAMIN) capsule Take 1 capsule by mouth daily.    [provider]  oseltamivir (TAMIFLU) 75 MG capsule Take 1 capsule (75 mg total) by mouth every 12 (twelve) hours. 02/23/19   Tommie Sams, DO  Vitamin D, Ergocalciferol, (DRISDOL) 50000 units CAPS capsule TAKE 1 CAPSULE BY MOUTH EVERY 7 DAYS FOR 1 MONTH. THEN MONTHLY THEREAFTER 08/30/18   [name], MD    Family History Family History  Problem Relation Age of Onset  . Hyperlipidemia Mother   . Arthritis Mother   . Hypertension Mother   . Hypertension Father   . Heart disease Father   . Heart disease Maternal Uncle   .  Coronary artery disease Maternal Uncle   . Diabetes Paternal Aunt   . Coronary artery disease Maternal Grandfather   . Heart attack Maternal Grandfather   . Breast cancer Maternal Aunt 45  . Colon cancer Maternal Grandmother   . Heart disease Paternal Grandmother   . Diabetes Brother   . Hyperlipidemia Brother   . Hypertension Brother   . Heart disease Brother     Social  History Social History   Tobacco Use  . Smoking status: Never Smoker  . Smokeless tobacco: Never Used  Substance Use Topics  . Alcohol use: Not Currently    Alcohol/week: 0.0 standard drinks    Frequency: Never  . Drug use: No     Allergies   Latex and Codeine   Review of Systems Review of Systems  Constitutional: Positive for chills and fever.  HENT: Positive for ear pain and sore throat.   Musculoskeletal:       Body aches.   Physical Exam Triage Vital Signs ED Triage Vitals  Enc Vitals Group     BP 02/23/19 0925 134/82     Pulse Rate 02/23/19 0925 70     Resp 02/23/19 0925 16     Temp 02/23/19 0925 98.3 F (36.8 C)     Temp Source 02/23/19 0925 Oral     SpO2 02/23/19 0925 100 %     Weight 02/23/19 0927 160 lb (72.6 kg)     Height 02/23/19 0927  (1.676 m)     Head Circumference --      Peak Flow --      Pain Score 02/23/19 0927 5     Pain Loc --      Pain Edu? --      Excl. in GC? --    Updated Vital Signs BP 134/82 (BP Location: Right Arm)   Pulse 70   Temp 98.3 F (36.8 C) (Oral)   Resp 16   Ht  (1.676 m)   Wt 72.6 kg   SpO2 100%   BMI 25.82 kg/m   Visual Acuity Right Eye Distance:   Left Eye Distance:   Bilateral Distance:    Right Eye Near:   Left Eye Near:    Bilateral Near:     Physical Exam Vitals signs and nursing note reviewed.  Constitutional:      General: She is not in acute distress.    Appearance: Normal appearance.  HENT:     Head: Normocephalic and atraumatic.     Right Ear: Tympanic membrane normal.     Left Ear: Tympanic membrane normal.     Mouth/Throat:     Pharynx: Oropharynx is clear. Posterior oropharyngeal erythema present.  Eyes:     General:        Right eye: No discharge.        Left eye: No discharge.     Conjunctiva/sclera: Conjunctivae normal.  Cardiovascular:     Rate and Rhythm: Normal rate and regular rhythm.  Pulmonary:     Effort: Pulmonary effort is normal.     Breath sounds: Normal  breath sounds.  Neurological:     Mental Status: She is alert.  Psychiatric:        Mood and Affect: Mood normal.        Behavior: Behavior normal.    UC Treatments / Results  Labs (all labs ordered are listed, but only abnormal results are displayed) Labs Reviewed  RAPID INFLUENZA A&B ANTIGENS ([facility] ONLY)  RAPID STREP  SCREEN ([facility] ONLY)  CULTURE, GROUP A STREP [facility])    EKG None  Radiology No results found.  Procedures Procedures (including critical care time)  Medications Ordered in UC Medications - No data to display  Initial Impression / Assessment and Plan / UC Course  I have reviewed the triage vital signs and the nursing notes.  Pertinent labs & imaging results that were available during my care of the patient were reviewed by me and considered in my medical decision making (see chart for details).    53 year old female presents with influenza-like illness.  Treating with Tamiflu.  Final Clinical Impressions(s) / UC Diagnoses   Final diagnoses:  Influenza-like illness     Discharge Instructions     Rest.   Fluids.  Medications as prescribed.  Take care  Dr. Adriana Simas     ED Prescriptions    Medication Sig Dispense Auth. Provider   oseltamivir (TAMIFLU) 75 MG capsule Take 1 capsule (75 mg total) by mouth every 12 (twelve) hours. 10 capsule Tommie Sams, DO     Controlled Substance Prescriptions [place] Controlled Substance Registry consulted? Not Applicable   Tommie Sams, DO 02/23/19 1020

## 2019-02-23 NOTE — Discharge Instructions (Signed)
Rest. ° °Fluids. ° °Medications as prescribed. ° °Take care ° °Dr. Zaire Vanbuskirk  °

## 2019-02-26 LAB — CULTURE, GROUP A STREP (THRC)

## 2019-03-07 ENCOUNTER — Other Ambulatory Visit: Payer: Self-pay

## 2019-03-07 ENCOUNTER — Ambulatory Visit: Payer: BC Managed Care – PPO | Admitting: Podiatry

## 2019-03-07 ENCOUNTER — Telehealth: Payer: Self-pay | Admitting: *Deleted

## 2019-03-07 ENCOUNTER — Encounter: Payer: Self-pay | Admitting: Podiatry

## 2019-03-07 DIAGNOSIS — M722 Plantar fascial fibromatosis: Secondary | ICD-10-CM | POA: Diagnosis not present

## 2019-03-07 DIAGNOSIS — M898X7 Other specified disorders of bone, ankle and foot: Secondary | ICD-10-CM

## 2019-03-07 DIAGNOSIS — T847XXD Infection and inflammatory reaction due to other internal orthopedic prosthetic devices, implants and grafts, subsequent encounter: Secondary | ICD-10-CM

## 2019-03-07 NOTE — Patient Instructions (Signed)
Pre-Operative Instructions  Congratulations, you have decided to take an important step towards improving your quality of life.  You can be assured that the doctors and staff at [facility] will be with you every step of the way.  Here are some important things you should know:  1. Plan to be at the surgery center/hospital at least 1 (one) hour prior to your scheduled time, unless otherwise directed by the surgical center/hospital staff.  You must have a responsible adult accompany you, remain during the surgery and drive you home.  Make sure you have directions to the surgical center/hospital to ensure you arrive on time. 2. If you are having surgery at [facility] or [facility] hospitals, you will need a copy of your medical history and physical form from your family physician within one month prior to the date of surgery. We will give you a form for your primary physician to complete.  3. We make every effort to accommodate the date you request for surgery.  However, there are times where surgery dates or times have to be moved.  We will contact you as soon as possible if a change in schedule is required.   4. No aspirin/ibuprofen for one week before surgery.  If you are on aspirin, any non-steroidal anti-inflammatory medications (Mobic, Aleve, Ibuprofen) should not be taken seven (7) days prior to your surgery.  You make take Tylenol for pain prior to surgery.  5. Medications - If you are taking daily heart and blood pressure medications, seizure, reflux, allergy, asthma, anxiety, pain or diabetes medications, make sure you notify the surgery center/hospital before the day of surgery so they can tell you which medications you should take or avoid the day of surgery. 6. No food or drink after midnight the night before surgery unless directed otherwise by surgical center/hospital staff. 7. No alcoholic beverages 24-hours prior to surgery.  No smoking 24-hours prior or 24-hours after  surgery. 8. Wear loose pants or shorts. They should be loose enough to fit over bandages, boots, and casts. 9. Don't wear slip-on shoes. Sneakers are preferred. 10. Bring your boot with you to the surgery center/hospital.  Also bring crutches or a walker if your physician has prescribed it for you.  If you do not have this equipment, it will be provided for you after surgery. 11. If you have not been contacted by the surgery center/hospital by the day before your surgery, call to confirm the date and time of your surgery. 12. Leave-time from work may vary depending on the type of surgery you have.  Appropriate arrangements should be made prior to surgery with your employer. 13. Prescriptions will be provided immediately following surgery by your doctor.  Fill these as soon as possible after surgery and take the medication as directed. Pain medications will not be refilled on weekends and must be approved by the doctor. 14. Remove nail polish on the operative foot and avoid getting pedicures prior to surgery. 15. Wash the night before surgery.  The night before surgery wash the foot and leg well with water and the antibacterial soap provided. Be sure to pay special attention to beneath the toenails and in between the toes.  Wash for at least three (3) minutes. Rinse thoroughly with water and dry well with a towel.  Perform this wash unless told not to do so by your physician.  Enclosed: 1 Ice pack (please put in freezer the night before surgery)   1 Hibiclens skin cleaner     Pre-op instructions  If you have any questions regarding the instructions, please do not hesitate to call our office.  [place]: [address], , [place] [address] -- [phone]  [place]: [address] [place], [place] [address] -- [phone]  [place]: [address]  [place], [place] [address] -- [phone]  [place]: [address], [address], [place], [place] [address] -- [phone]  Website:  [email or web address] 

## 2019-03-07 NOTE — Progress Notes (Signed)
Presents today for follow-up of plantar fasciitis bilaterally states that they still hurt some but are a lot better than they were before.  Objective: Vital signs are stable she is alert and oriented x3.  Pulses are palpable.  She has pain on palpation medial plantar calcaneal o tubercle of the left heel none on the right.  She still has some tenderness on palpation of the sesamoid left.  Assessment: Sesamoiditis left.  Also has bunion deformity there.  May just need to correct the bunion deformity at this point.  Also plantar fasciitis resolved right continuous left.  Plan: Discussed etiology pathology and surgical therapies at this point time I injected the left heel today with 20 mg Kenalog 5 mg Marcaine point of maximal tenderness.  She will follow-up with [facility] for orthotics for regular plantar fasciitis but we will also offload the first metatarsal phalangeal joint of the left foot.  She has painful sesamoiditis with her bunion deformity.

## 2019-03-07 NOTE — Telephone Encounter (Signed)
"  I just left Dr. Geryl Rankins office and he said I needed surgery.  He told me to call you to schedule a date."  I don't have any information on you at this time.  I will receive it tomorrow.  I can call you back then to schedule an appointment.  "Okay, that will be fine."

## 2019-03-08 NOTE — Telephone Encounter (Signed)
I attempted to return her call.  I left her a message to call me back. 

## 2019-03-16 ENCOUNTER — Other Ambulatory Visit: Payer: BC Managed Care – PPO | Admitting: Orthotics

## 2019-04-22 ENCOUNTER — Telehealth: Payer: Self-pay | Admitting: *Deleted

## 2019-04-22 NOTE — Telephone Encounter (Signed)
I attempted to return her call again.  I left her a message to give me a call back.

## 2019-04-22 NOTE — Telephone Encounter (Signed)
"  I'm returning your call."  I was calling to see if you would like to schedule your surgery with Dr. Al Corpus.  "Yes, I'd like to schedule that appointment."  He does surgeries on Fridays, do you have a date you like?  "It will have to be after June 8 because I'm still giving out the meals."  Dr. Al Corpus can do it on June 03, 2019.  "That date will be fine."  I'll get it scheduled.  Someone from the surgical center will give you a call a day or two prior to your surgery date.  They will give you your arrival time.  You need to register online for the surgical center, instructions are in the brochure that we gave you.  "Can I do that anytime or do I need to wait closer to the date?"  You can do it at your convenience.

## 2019-05-17 ENCOUNTER — Ambulatory Visit: Payer: Self-pay | Admitting: *Deleted

## 2019-05-17 NOTE — Telephone Encounter (Signed)
Appointment scheduled.

## 2019-05-17 NOTE — Telephone Encounter (Signed)
Summary: new onset of rectal bleeding / hx of hemorrhoids / could not wait for NT for long   Patient calling and states that a new onset of bleeding from her rectum started on Thursday 05/12/2019. States that she does have a history of hemorrhoids and was previously prescribed creams for them, but they are expired at this time. States that there is not blood every time she goes to the bathroom, but it is there, sometimes, even if she just goes to urinate. Waiting on hold for nurse triage for 5 mins and then patient could not hold any longer. Explained the increase in calls this morning, but would have a nurse give her a call back.     Call to office to see if patient could be scheduled. Reason for Disposition . MILD rectal bleeding (more than just a few drops or streaks)  Answer Assessment - Initial Assessment Questions 1. APPEARANCE of BLOOD: "What color is it?" "Is it passed separately, on the surface of the stool, or mixed in with the stool?"      Bright red , mixed and surface of stool 2. AMOUNT: "How much blood was passed?"      Teaspoon-to tablespoon with BM 3. FREQUENCY: "How many times has blood been passed with the stools?"      Every time patient has BM- since last Thursday 4. ONSET: "When was the blood first seen in the stools?" (Days or weeks)      5/21 5. DIARRHEA: "Is there also some diarrhea?" If so, ask: "How many diarrhea stools were passed in past 24 hours?"      no 6. CONSTIPATION: "Do you have constipation?" If so, "How bad is it?"     Patient was constipated 5/21- one time episode 7. RECURRENT SYMPTOMS: "Have you had blood in your stools before?" If so, ask: "When was the last time?" and "What happened that time?"      Yes- hemorrhoid flare over 1 year- medication took care of things 8. BLOOD THINNERS: "Do you take any blood thinners?" (e.g., Coumadin/warfarin, Pradaxa/dabigatran, aspirin)     No 9. OTHER SYMPTOMS: "Do you have any other symptoms?"  (e.g., abdominal pain,  vomiting, dizziness, fever)     Abdominal pain, some nausea 10. PREGNANCY: "Is there any chance you are pregnant?" "When was your last menstrual period?"       n/a  Protocols used: RECTAL BLEEDING-A-AH

## 2019-05-18 ENCOUNTER — Other Ambulatory Visit: Payer: Self-pay

## 2019-05-18 ENCOUNTER — Ambulatory Visit: Payer: BC Managed Care – PPO | Admitting: Family Medicine

## 2019-05-18 ENCOUNTER — Encounter: Payer: Self-pay | Admitting: Family Medicine

## 2019-05-18 VITALS — BP 130/62 | HR 82 | Temp 98.5°F | Ht 66.0 in | Wt 173.0 lb

## 2019-05-18 DIAGNOSIS — K649 Unspecified hemorrhoids: Secondary | ICD-10-CM

## 2019-05-18 DIAGNOSIS — M5432 Sciatica, left side: Secondary | ICD-10-CM | POA: Diagnosis not present

## 2019-05-18 LAB — CBC
HCT: 37.1 % (ref 36.0–46.0)
Hemoglobin: 12.7 g/dL (ref 12.0–15.0)
MCHC: 34.3 g/dL (ref 30.0–36.0)
MCV: 91.6 fl (ref 78.0–100.0)
Platelets: 165 10*3/uL (ref 150.0–400.0)
RBC: 4.05 Mil/uL (ref 3.87–5.11)
RDW: 13.1 % (ref 11.5–15.5)
WBC: 5.3 10*3/uL (ref 4.0–10.5)

## 2019-05-18 MED ORDER — HYDROCORTISONE (PERIANAL) 2.5 % EX CREA: 1.0000 "application " | TOPICAL_CREAM | Freq: Two times a day (BID) | CUTANEOUS | 3 refills | Status: DC

## 2019-05-18 MED ORDER — GABAPENTIN 300 MG PO CAPS: 300.0000 mg | ORAL_CAPSULE | Freq: Two times a day (BID) | ORAL | 3 refills | Status: DC

## 2019-05-18 MED ORDER — DOCUSATE SODIUM 100 MG PO CAPS: 100.0000 mg | ORAL_CAPSULE | Freq: Every day | ORAL | 1 refills | Status: DC

## 2019-05-18 NOTE — Progress Notes (Signed)
Subjective:    Patient ID: Kelly [name], female    DOB: 08/06/1966, 53 y.o.   MRN: [id number]  HPI   Patient presents to clinic due to seeing bright red blood after having bowel movement in suspecting she is a possible hemorrhoid.  Patient states she has had issues with constipation off and on throughout the years.  Has noticed herself being more constipated lately and having to work hard to have bowel movement.  Does not take any sort of stool softener or additional fiber at this time.  Also would like to get back on gabapentin.  This few years back for chronic low back pain/muscle pain.  States it worked well, went off of it for period of time to see if she can manage pain without it, but pain is slowly started to come back.  Trying to do stretching and range of motion exercises however pain continues to persist.  Denies numbness in lower extremities.  Denies saddle anesthesia.  Denies loss of bowel or bladder control.  Patient Active Problem List   Diagnosis Date Noted  . Bilateral hand pain 01/04/2019  . Impingement syndrome of shoulder region 10/16/2017  . Osteoarthritis of knee 10/16/2017  . Knee pain, bilateral 10/03/2017  . Insomnia 10/03/2017  . Chest pain at rest 07/23/2017  . Protein-calorie malnutrition (HCC) 11/06/2016  . Vitamin B12 deficiency due to intestinal malabsorption 11/06/2016  . Vitamin D deficiency 11/06/2016  . S/P bariatric surgery 11/06/2016  . Abdominal pain, RUQ 03/25/2016  . Postoperative malabsorption 03/04/2016  . Back pain 01/16/2016  . Impaired fasting glucose 06/18/2015  . GERD (gastroesophageal reflux disease) 06/17/2015  . Lumbar spine strain, initial encounter 05/30/2015  . Spondylosis of lumbar region without myelopathy or radiculopathy 05/30/2015  . Essential hypertension 10/21/2014  . Abnormal involuntary movements 10/21/2014  . Aural vertigo 10/18/2014  . Visit for preventive health examination 09/19/2014  . S/P hysterectomy with  oophorectomy 09/19/2014  . Absence of both cervix and uterus, acquired 09/19/2014  . Generalized anxiety disorder 09/18/2014  . Snoring 09/18/2014  . Abnormal respiratory rate 09/18/2014  . Irritable bowel syndrome 11/11/2013  . Shoulder pain, right 11/11/2013  . Orthostatic hypotension 03/14/2013  . Menopause ovarian failure 09/13/2012  . Major depressive disorder, recurrent episode with anxious distress (HCC) 07/27/2012  . Migraine headache without aura 07/27/2012  . Other chronic allergic conjunctivitis 07/27/2012  . Sciatica of left side 01/08/2012   Social History   Tobacco Use  . Smoking status: Never Smoker  . Smokeless tobacco: Never Used  Substance Use Topics  . Alcohol use: Not Currently    Alcohol/week: 0.0 standard drinks    Frequency: Never   Family History  Problem Relation Age of Onset  . Hyperlipidemia Mother   . Arthritis Mother   . Hypertension Mother   . Hypertension Father   . Heart disease Father   . Heart disease Maternal Uncle   . Coronary artery disease Maternal Uncle   . Diabetes Paternal Aunt   . Coronary artery disease Maternal Grandfather   . Heart attack Maternal Grandfather   . Breast cancer Maternal Aunt 45  . Colon cancer Maternal Grandmother   . Heart disease Paternal Grandmother   . Diabetes Brother   . Hyperlipidemia Brother   . Hypertension Brother   . Heart disease Brother     Review of Systems  Constitutional: Negative for chills, fatigue and fever.  HENT: Negative for congestion, ear pain, sinus pain and sore throat.   Eyes: Negative.  Respiratory: Negative for cough, shortness of breath and wheezing.   Cardiovascular: Negative for chest pain, palpitations and leg swelling.  Gastrointestinal: Negative for abdominal pain, diarrhea, nausea and vomiting. Positive bright red blood with stool, ?hemorrhoids  Genitourinary: Negative for dysuria, frequency and urgency.  Musculoskeletal: +low back pain, chronic  Skin: Negative for  color change, pallor and rash.  Neurological: Negative for syncope, light-headedness and headaches.  Psychiatric/Behavioral: The patient is not nervous/anxious.       Objective:   Physical Exam Vitals signs and nursing note reviewed.  Constitutional:      General: She is not in acute distress.    Appearance: She is not ill-appearing, toxic-appearing or diaphoretic.  HENT:     Head: Normocephalic and atraumatic.  Eyes:     General: No scleral icterus.    Extraocular Movements: Extraocular movements intact.     Conjunctiva/sclera: Conjunctivae normal.     Pupils: Pupils are equal, round, and reactive to light.  Cardiovascular:     Rate and Rhythm: Normal rate and regular rhythm.  Pulmonary:     Effort: Pulmonary effort is normal.     Breath sounds: Normal breath sounds.  Abdominal:     General: There is no distension.     Palpations: Abdomen is soft.     Tenderness: There is no abdominal tenderness. There is no guarding or rebound.  Genitourinary:    Rectum: External hemorrhoid present.     Comments: External hemorrhoid seen. No internal hemorrhoid felt on digital rectal exam. Musculoskeletal: Normal range of motion.     Comments: ROM back, LE intact  Skin:    General: Skin is warm and dry.     Coloration: Skin is not jaundiced or pale.  Neurological:     General: No focal deficit present.     Mental Status: She is alert and oriented to person, place, and time.     Gait: Gait normal.  Psychiatric:        Mood and Affect: Mood normal.        Behavior: Behavior normal.    Vitals:   05/18/19 1039  BP: 130/62  Pulse: 82  Temp: 98.5 F (36.9 C)  SpO2: 93%      Assessment & Plan:   Hemorrhoids- hemorrhoids seen on exam.  Patient will use topical rectal steroid cream to help reduce hemorrhoid.  She also will begin taking Colace once daily to help avoid straining while having bowel movement. We will check CBC in clinic to r/o GI bleed.   Sciatica left side/low back pain  left side- patient will resume use of gabapentin twice daily for pain and sciatica of left side.  Patient advised to follow-up regularly with PCP and she is aware she can return to clinic anytime with issues or concerns.

## 2019-05-18 NOTE — Progress Notes (Signed)
Pre visit review using our clinic review tool, if applicable. No additional management support is needed unless otherwise documented below in the visit note. 

## 2019-05-18 NOTE — Patient Instructions (Signed)
Hemorrhoids Hemorrhoids are swollen veins that may develop:  In the butt (rectum). These are called internal hemorrhoids.  Around the opening of the butt (anus). These are called external hemorrhoids. Hemorrhoids can cause pain, itching, or bleeding. Most of the time, they do not cause serious problems. They usually get better with diet changes, lifestyle changes, and other home treatments. What are the causes? This condition may be caused by:  Having trouble pooping (constipation).  Pushing hard (straining) to poop.  Watery poop (diarrhea).  Pregnancy.  Being very overweight (obese).  Sitting for long periods of time.  Heavy lifting or other activity that causes you to strain.  Anal sex.  Riding a bike for a long period of time. What are the signs or symptoms? Symptoms of this condition include:  Pain.  Itching or soreness in the butt.  Bleeding from the butt.  Leaking poop.  Swelling in the area.  One or more lumps around the opening of your butt. How is this diagnosed? A doctor can often diagnose this condition by looking at the affected area. The doctor may also:  Do an exam that involves feeling the area with a gloved hand (digital rectal exam).  Examine the area inside your butt using a small tube (anoscope).  Order blood tests. This may be done if you have lost a lot of blood.  Have you get a test that involves looking inside the colon using a flexible tube with a camera on the end (sigmoidoscopy or colonoscopy). How is this treated? This condition can usually be treated at home. Your doctor may tell you to change what you eat, make lifestyle changes, or try home treatments. If these do not help, procedures can be done to remove the hemorrhoids or make them smaller. These may involve:  Placing rubber bands at the base of the hemorrhoids to cut off their blood supply.  Injecting medicine into the hemorrhoids to shrink them.  Shining a type of light  energy onto the hemorrhoids to cause them to fall off.  Doing surgery to remove the hemorrhoids or cut off their blood supply. Follow these instructions at home: Eating and drinking   Eat foods that have a lot of fiber in them. These include whole grains, beans, nuts, fruits, and vegetables.  Ask your doctor about taking products that have added fiber (fibersupplements).  Reduce the amount of fat in your diet. You can do this by: ? Eating low-fat dairy products. ? Eating less red meat. ? Avoiding processed foods.  Drink enough fluid to keep your pee (urine) pale yellow. Managing pain and swelling   Take a warm-water bath (sitz bath) for 20 minutes to ease pain. Do this 3-4 times a day. You may do this in a bathtub or using a portable sitz bath that fits over the toilet.  If told, put ice on the painful area. It may be helpful to use ice between your warm baths. ? Put ice in a plastic bag. ? Place a towel between your skin and the bag. ? Leave the ice on for 20 minutes, 2-3 times a day. General instructions  Take over-the-counter and prescription medicines only as told by your doctor. ? Medicated creams and medicines may be used as told.  Exercise often. Ask your doctor how much and what kind of exercise is best for you.  Go to the bathroom when you have the urge to poop. Do not wait.  Avoid pushing too hard when you poop.  Keep your   butt dry and clean. Use wet toilet paper or moist towelettes after pooping.  Do not sit on the toilet for a long time.  Keep all follow-up visits as told by your doctor. This is important. Contact a doctor if you:  Have pain and swelling that do not get better with treatment or medicine.  Have trouble pooping.  Cannot poop.  Have pain or swelling outside the area of the hemorrhoids. Get help right away if you have:  Bleeding that will not stop. Summary  Hemorrhoids are swollen veins in the butt or around the opening of the butt.   They can cause pain, itching, or bleeding.  Eat foods that have a lot of fiber in them. These include whole grains, beans, nuts, fruits, and vegetables.  Take a warm-water bath (sitz bath) for 20 minutes to ease pain. Do this 3-4 times a day. This information is not intended to replace advice given to you by your health care provider. Make sure you discuss any questions you have with your health care provider. Document Released: 09/16/2008 Document Revised: 04/29/2018 Document Reviewed: 04/29/2018 Elsevier Interactive Patient Education  2019 [employer]  

## 2019-06-01 ENCOUNTER — Telehealth: Payer: Self-pay | Admitting: *Deleted

## 2019-06-01 ENCOUNTER — Other Ambulatory Visit: Payer: Self-pay | Admitting: Podiatry

## 2019-06-01 MED ORDER — OXYCODONE-ACETAMINOPHEN 10-325 MG PO TABS: 1.0000 | ORAL_TABLET | Freq: Four times a day (QID) | ORAL | 0 refills | Status: AC | PRN

## 2019-06-01 MED ORDER — ONDANSETRON HCL 4 MG PO TABS: 4.0000 mg | ORAL_TABLET | Freq: Three times a day (TID) | ORAL | 0 refills | Status: DC | PRN

## 2019-06-01 MED ORDER — CEPHALEXIN 500 MG PO CAPS: 500.0000 mg | ORAL_CAPSULE | Freq: Three times a day (TID) | ORAL | 0 refills | Status: DC

## 2019-06-01 NOTE — Telephone Encounter (Signed)
DOS 06/03/2019, CPT CODES: 91791 - REMOVAL FIXATION DEEP KWIRE/SCREW AND 50569 - EXOSTECTOMY RIGHT FOOT  BCBS: Policy Effective : 79/48/0165 - 12/21/9998   Member Liability Summary      In-Network    Max Per Benefit Period Year-to-Date Remaining  CoInsurance  20%   Deductible  $1250.00 $1250.00  Out-Of-Pocket  $4890.00 $4442.29   In Network  Copay Coinsurance  Not Applicable  53% per [place]

## 2019-06-03 DIAGNOSIS — Z4889 Encounter for other specified surgical aftercare: Secondary | ICD-10-CM | POA: Diagnosis not present

## 2019-06-03 DIAGNOSIS — M25774 Osteophyte, right foot: Secondary | ICD-10-CM | POA: Diagnosis not present

## 2019-06-08 ENCOUNTER — Other Ambulatory Visit: Payer: Self-pay

## 2019-06-08 ENCOUNTER — Ambulatory Visit (INDEPENDENT_AMBULATORY_CARE_PROVIDER_SITE_OTHER): Payer: BC Managed Care – PPO

## 2019-06-08 ENCOUNTER — Ambulatory Visit (INDEPENDENT_AMBULATORY_CARE_PROVIDER_SITE_OTHER): Payer: Self-pay | Admitting: Podiatry

## 2019-06-08 VITALS — Temp 98.3°F

## 2019-06-08 DIAGNOSIS — Z9889 Other specified postprocedural states: Secondary | ICD-10-CM | POA: Diagnosis not present

## 2019-06-08 DIAGNOSIS — T847XXD Infection and inflammatory reaction due to other internal orthopedic prosthetic devices, implants and grafts, subsequent encounter: Secondary | ICD-10-CM | POA: Diagnosis not present

## 2019-06-08 NOTE — Progress Notes (Signed)
She presents today 1 week status post removal of fixation first metatarsal phalangeal joint right foot with exostectomy.  Date of surgery 06/03/2019 denies fever chills nausea vomiting muscle aches and pains.  Objective: Also stable she is alert and oriented x3.  There is no erythema edema cellulitis drainage or odor.  Sutures are intact margins well coapted she appears to be healing very nicely.  Assessment: Well-healing surgical foot.  Plan: Redressed today dressed a compressive dressing like to follow-up with her in 1 week.

## 2019-06-15 ENCOUNTER — Other Ambulatory Visit: Payer: Self-pay | Admitting: Lab

## 2019-06-15 ENCOUNTER — Other Ambulatory Visit: Payer: Self-pay

## 2019-06-15 ENCOUNTER — Encounter: Payer: Self-pay | Admitting: Podiatry

## 2019-06-15 ENCOUNTER — Ambulatory Visit (INDEPENDENT_AMBULATORY_CARE_PROVIDER_SITE_OTHER): Payer: BC Managed Care – PPO | Admitting: Podiatry

## 2019-06-15 ENCOUNTER — Ambulatory Visit: Payer: BC Managed Care – PPO | Admitting: Podiatry

## 2019-06-15 VITALS — Temp 97.3°F

## 2019-06-15 DIAGNOSIS — Z9889 Other specified postprocedural states: Secondary | ICD-10-CM

## 2019-06-15 DIAGNOSIS — T847XXD Infection and inflammatory reaction due to other internal orthopedic prosthetic devices, implants and grafts, subsequent encounter: Secondary | ICD-10-CM

## 2019-06-15 DIAGNOSIS — M5432 Sciatica, left side: Secondary | ICD-10-CM

## 2019-06-15 MED ORDER — GABAPENTIN 300 MG PO CAPS: 300.0000 mg | ORAL_CAPSULE | Freq: Two times a day (BID) | ORAL | 2 refills | Status: DC

## 2019-06-15 NOTE — Telephone Encounter (Signed)
Refill for 3 months sent in for now   She has not had follow up in awhile with PCP  Please get her scheduled

## 2019-06-15 NOTE — Telephone Encounter (Signed)
[facility] [address] [place] [address]   Requested 90 Days supply  Gabapentin 300 MG capsule Quantity 180 1 capsule by mouth twice a day.

## 2019-06-15 NOTE — Progress Notes (Signed)
She presents today for her second postop visit date of surgery 06/03/2019 removal painful fixation exostectomy first metatarsal the right foot.  He states that is doing a whole lot better than it was and now that the sutures are out it feels better.  Objective: Vital signs are stable she is alert and oriented x3.  Pulses are palpable.  Dry sterile dressing removed from the forefoot demonstrates sutures are intact margins are well coapted went ahead and remove the sutures today margins remained well coapted.  There is no signs of infection.  Assessment: Well-healing surgical foot.  Plan: Allow her to try to get back into her regular tennis shoes that she can get back to work and I will follow-up with her on an as-needed basis.  If she is not doing 100% better she will follow-up with me in 2 weeks

## 2019-07-06 ENCOUNTER — Ambulatory Visit: Payer: BC Managed Care – PPO

## 2019-07-06 ENCOUNTER — Other Ambulatory Visit: Payer: BC Managed Care – PPO

## 2019-07-06 DIAGNOSIS — Z9889 Other specified postprocedural states: Secondary | ICD-10-CM

## 2019-07-06 DIAGNOSIS — T847XXD Infection and inflammatory reaction due to other internal orthopedic prosthetic devices, implants and grafts, subsequent encounter: Secondary | ICD-10-CM

## 2019-07-20 ENCOUNTER — Ambulatory Visit: Payer: BC Managed Care – PPO

## 2019-07-20 ENCOUNTER — Encounter: Payer: BC Managed Care – PPO | Admitting: Podiatry

## 2019-07-20 DIAGNOSIS — Z9889 Other specified postprocedural states: Secondary | ICD-10-CM

## 2019-07-20 DIAGNOSIS — T847XXD Infection and inflammatory reaction due to other internal orthopedic prosthetic devices, implants and grafts, subsequent encounter: Secondary | ICD-10-CM

## 2019-07-20 NOTE — Progress Notes (Signed)
This encounter was created in error - please disregard.

## 2019-07-22 ENCOUNTER — Encounter: Payer: Self-pay | Admitting: Podiatry

## 2019-09-16 ENCOUNTER — Other Ambulatory Visit: Payer: Self-pay | Admitting: Internal Medicine

## 2019-09-16 DIAGNOSIS — Z1231 Encounter for screening mammogram for malignant neoplasm of breast: Secondary | ICD-10-CM

## 2019-09-16 DIAGNOSIS — N644 Mastodynia: Secondary | ICD-10-CM

## 2019-09-22 ENCOUNTER — Other Ambulatory Visit: Payer: BC Managed Care – PPO

## 2019-10-10 ENCOUNTER — Ambulatory Visit
Admission: RE | Admit: 2019-10-10 | Discharge: 2019-10-10 | Disposition: A | Discharge status: Home / Self Care | Payer: BC Managed Care – PPO | Source: Ambulatory Visit | Attending: Internal Medicine | Admitting: Internal Medicine

## 2019-10-10 DIAGNOSIS — N644 Mastodynia: Secondary | ICD-10-CM

## 2019-11-02 LAB — HEMOGLOBIN A1C: Hemoglobin A1C: 5.6

## 2019-11-02 LAB — LIPID PANEL
Cholesterol: 205 — AB (ref 0–200)
HDL: 82 — AB (ref 35–70)
LDL Cholesterol: 109
Triglycerides: 72 (ref 40–160)

## 2019-11-08 ENCOUNTER — Other Ambulatory Visit: Payer: Self-pay

## 2019-11-10 ENCOUNTER — Other Ambulatory Visit: Payer: Self-pay

## 2019-11-10 ENCOUNTER — Encounter: Payer: Self-pay | Admitting: Internal Medicine

## 2019-11-10 ENCOUNTER — Ambulatory Visit (INDEPENDENT_AMBULATORY_CARE_PROVIDER_SITE_OTHER): Payer: BC Managed Care – PPO | Admitting: Internal Medicine

## 2019-11-10 VITALS — BP 124/70 | HR 98 | Temp 96.4°F | Resp 14 | Ht 66.0 in | Wt 182.4 lb

## 2019-11-10 DIAGNOSIS — K909 Intestinal malabsorption, unspecified: Secondary | ICD-10-CM

## 2019-11-10 DIAGNOSIS — E538 Deficiency of other specified B group vitamins: Secondary | ICD-10-CM | POA: Diagnosis not present

## 2019-11-10 DIAGNOSIS — Z23 Encounter for immunization: Secondary | ICD-10-CM | POA: Diagnosis not present

## 2019-11-10 DIAGNOSIS — R7301 Impaired fasting glucose: Secondary | ICD-10-CM | POA: Diagnosis not present

## 2019-11-10 DIAGNOSIS — K911 Postgastric surgery syndromes: Secondary | ICD-10-CM

## 2019-11-10 DIAGNOSIS — Z0001 Encounter for general adult medical examination with abnormal findings: Secondary | ICD-10-CM

## 2019-11-10 DIAGNOSIS — N644 Mastodynia: Secondary | ICD-10-CM

## 2019-11-10 DIAGNOSIS — Z0000 Encounter for general adult medical examination without abnormal findings: Secondary | ICD-10-CM | POA: Diagnosis not present

## 2019-11-10 DIAGNOSIS — N6310 Unspecified lump in the right breast, unspecified quadrant: Secondary | ICD-10-CM | POA: Diagnosis not present

## 2019-11-10 DIAGNOSIS — H60543 Acute eczematoid otitis externa, bilateral: Secondary | ICD-10-CM

## 2019-11-10 MED ORDER — MOMETASONE FUROATE 0.1 % EX CREA: 1.0000 "application " | TOPICAL_CREAM | Freq: Every day | CUTANEOUS | 0 refills | Status: DC

## 2019-11-10 MED ORDER — CYANOCOBALAMIN 1000 MCG/ML IJ SOLN: 1000.0000 ug | INTRAMUSCULAR | 0 refills | Status: DC

## 2019-11-10 MED ORDER — CYANOCOBALAMIN 1000 MCG/ML IJ SOLN
1000.0000 ug | Freq: Once | INTRAMUSCULAR | Status: AC
  Administered 2019-11-10: 12:00:00 1000 ug via INTRAMUSCULAR

## 2019-11-10 MED ORDER — "SYRINGE 25G X 1"" 3 ML MISC": 0 refills | Status: DC

## 2019-11-10 MED ORDER — ERGOCALCIFEROL 1.25 MG (50000 UT) PO CAPS: 50000.0000 [IU] | ORAL_CAPSULE | ORAL | 0 refills | Status: DC

## 2019-11-10 NOTE — Patient Instructions (Addendum)
Give yourself a B12 injection twice a week for 4 doses.  Then start injecting once a month   Take the mega dose of Vitami nD twice a week for one month,  Then reduce to once weekly for liFE\  MOMETASONE CREAM FOR THE ITCHY EARS.  USE AS NEEDED  REFERRAL TO DR BYRNETT IS IN PROGRESS   TALK YOU TOUR SURGEON ABOUT THE DUMPING SYNDROME SYMPTOMS    Eating Plan for Dumping Syndrome Dumping syndrome is a term that describes a group of symptoms that occur when the stomach empties too quickly. This most often happens after weight loss (bariatric) or stomach (gastric) surgery. Changing how and what you eat can often help to control these symptoms. Work with your health care provider or a diet and nutrition specialist (dietitian) to make specific changes to what and how you eat to help prevent and treat symptoms of dumping syndrome. What are tips for following this plan? Reading food labels  Check food labels for added sugar. Avoid eating simple carbohydrates and foods that have added sugar, such as fruit juices, white bread, pastries, and sweets. Foods that are high in sugar are especially likely to cause symptoms.  Check food ingredients for whole grains. Try to make sure that at least half of your daily servings of grains come from whole grains. These foods are high in fiber, which helps to slow down digestion. Shopping  Avoid dairy products if they aggravate your symptoms. Try lactose-free dairy products instead.  Buy and eat foods that have complex carbohydrates, such as whole grains, vegetables, and beans.  Avoid processed foods. These may have added sugar and fat. Cooking  Your health care provider or dietitian may suggest that you increase the thickness of foods by adding food thickening products, such as pectin or corn starch. Follow instructions from your health care provider or dietitian on how to thicken foods. Meal planning  Eat 5-6 small meals each day instead of 3 large  meals.  Do not drink liquids with meals. ? Avoid drinking liquids for 30 minutes before meals. ? Avoid drinking liquids for at least 30 minutes after meals.  Eat a balanced diet. A balanced diet includes: ? 6-8 oz of grains each day. ? 2-3 cups of vegetables each day. ? 1-2 cups of fruit each day. ? 2-3 cups of dairy each day. ? Your dietitian may determine how much meat and other protein foods you should eat based on your body weight.  Eat one food that is high in protein with each meal and snack. Lifestyle   Do not drink alcohol.  Lie down for 15-30 minutes after meals. This will help to prevent light-headedness and will slow down the emptying of your stomach.  Follow your health care provider's instructions about what fluids you should avoid. Generally, you should avoid drinks that have: ? Sugar. ? Carbonation. ? Caffeine. General information  Avoid eating very hot or very cold foods. These may aggravate your symptoms.  Cut food into small pieces and chew thoroughly before you swallow.  Take a fiber supplement if recommended by your health care provider.  Take protein or other nutritional supplements as told by your health care provider or dietitian.  Take over-the-counter and prescription medicines only as told by your health care provider. What foods can I eat?  Fruits All fresh, frozen, and canned fruits that are packed in water. Unsweetened applesauce. Vegetables All fresh, frozen, and canned vegetables. Grains Whole-grain bread. Brown rice. Whole-grain pasta. Corn tortillas. Multigrain  crackers. Meats and other proteins Lean meats. Chicken. Fish. Eggs. Tofu. Deli meat. Nuts and seeds. Unsweetened nut butters. Beans. Dairy Low-fat or fat-free milk. Low-fat plain yogurt. Light yogurt. Cheese. Lactose-free dairy products. Beverages Water. Sugar-free, non-carbonated soft drinks. Unsweetened decaf tea. Unsweetened decaf coffee. Sugar-free hot chocolate. Unsweetened  soy, almond, or rice milk. Condiments Sugar substitutes. Sugar-free jam and sugar-free jelly. Sugar-free syrup. Sweets and desserts Sugar-free gelatin. Sugar-free pudding. Sugar-free ice cream. Fats and oils Butter. Vegetable oil. Salad dressings. Mayonnaise. Avocado. Cream cheese. Other Broth. All spices and herbs. Over-the-counter fiber supplements. The items listed above may not be a complete list of foods and beverages you can eat. Contact a dietitian for more options. What foods are not recommended? Fruits Fruits that are dried with added sugar. Canned fruit in syrup. Fruit juice. Vegetables Breaded or fried vegetables. Grains Cereals with added sugar. Pastries. Cakes. Sweet and white breads. Flavored instant oatmeal. Meats and other proteins Breaded or fried meats. Meats that are cooked with glazes or sweetened sauces. Dairy Flavored milk. Sugar-sweetened yogurt. Milkshakes. Beverages Hot chocolate. Regular sugar carbonated beverages. Diet carbonated beverages. Sweetened coconut milk. Sweetened coffee drinks. Caffeinated coffee or tea. Condiments Honey. Sugar. Syrup. Jam and jelly. Molasses. Agave nectar. Sweets and desserts Cakes. Candies. Ice Cream. Donuts. Puddings. Pies. Chocolate. Fats and oils Frostings. Sweetened nut butters. Other Candied nuts. Candied fruit. The items listed above may not be a complete list of foods and beverages you should avoid. Contact a dietitian for more information. Summary  Dumping syndrome is a term that is used to describe a group of symptoms that occur when the stomach empties too quickly. Changing how and what you eat can often help to control these symptoms.  Your health care provider or dietitian may recommend specific changes to your diet to help prevent and treat symptoms of dumping syndrome. General recommendations include eating small meals, not drinking liquids 30 minutes before or after a meal, and avoiding sugar. This  information is not intended to replace advice given to you by your health care provider. Make sure you discuss any questions you have with your health care provider. Document Released: 11/27/2011 Document Revised: 03/31/2019 Document Reviewed: 12/09/2017 Elsevier Patient Education  2020 [employer].

## 2019-11-10 NOTE — Progress Notes (Signed)
Patient ID: Kelly [name], female    DOB: 1966-04-09  Age: 53 y.o. MRN: [id number]  The patient is here for annual preventive  examination and management of other chronic and acute problems.  PAP SMEAR:  TAH/BSO 2015 COLONOSCOPY  NEEDED   US BREAST OCT 19 RIGHT  SIDE FOR EVAL OF BREAST TENDERNESS    The risk factors are reflected in the social history.  The roster of all physicians providing medical care to patient - is listed in the Snapshot section of the chart.  Activities of daily living:  The patient is 100% independent in all ADLs: dressing, toileting, feeding as well as independent mobility  Home safety : The patient has smoke detectors in the home. They wear seatbelts.  There are no firearms at home. There is no violence in the home.   There is no risks for hepatitis, STDs or HIV. There is no   history of blood transfusion. They have no travel history to infectious disease endemic areas of the world.  The patient has seen their dentist in the last six month. They have seen their eye doctor in the last year. They admit to slight hearing difficulty with regard to whispered voices and some television programs.  They have deferred audiologic testing in the last year.  They do not  have excessive sun exposure. Discussed the need for sun protection: hats, long sleeves and use of sunscreen if there is significant sun exposure.   Diet: the importance of a healthy diet is discussed. They do have a healthy diet.  The benefits of regular aerobic exercise were discussed. She walks 4 times per week ,  20 minutes.   Depression screen: there are no signs or vegative symptoms of depression- irritability, change in appetite, anhedonia, sadness/tearfullness.  Cognitive assessment: the patient manages all their financial and personal affairs and is actively engaged. They could relate day,date,year and events; recalled 2/3 objects at 3 minutes; performed clock-face test normally.  The following  portions of the patient's history were reviewed and updated as appropriate: allergies, current medications, past family history, past medical history,  past surgical history, past social history  and problem list.  Visual acuity was not assessed per patient preference since she has regular follow up with her ophthalmologist. Hearing and body mass index were assessed and reviewed.   During the course of the visit the patient was educated and counseled about appropriate screening and preventive services including : fall prevention , diabetes screening, nutrition counseling, colorectal cancer screening, and recommended immunizations.    CC: The primary encounter diagnosis was Breast mass, right. Diagnoses of Need for immunization against influenza, Vitamin B12 deficiency due to intestinal malabsorption, Encounter for general adult medical examination with abnormal findings, Impaired fasting glucose, Postsurgical dumping syndrome, Breast tenderness in female, and Eczema of both external ears were also pertinent to this visit.  Obesity:  S/p gastric bypass . Gained 40 lbs due to lack of exercise due to COVID restrictions   Has began walking  But not daily   She has been having recurrent post prandial diarrhea with fecal urgency for the past 4 years t. .  Right breast tenderness,  Despite normal  Diagnostic mammogram.   She has recurrent itching of both ear canals . Discussed trial of  Mometasone    History Kelly [name] has a past medical history of Chest pain (2002).   She has a past surgical history that includes Abdominal hysterectomy (35); Gastric bypass (2016); and Spine surgery.  Her family history includes Arthritis in her mother; Breast cancer (age of onset: 8445) in her maternal aunt; Colon cancer in her maternal grandmother; Coronary artery disease in her maternal grandfather and maternal uncle; Diabetes in her brother and paternal aunt; Heart attack in her maternal grandfather; Heart disease  in her brother, father, maternal uncle, and paternal grandmother; Hyperlipidemia in her brother and mother; Hypertension in her brother, father, and mother.She reports that she has never smoked. She has never used smokeless tobacco. She reports previous alcohol use. She reports that she does not use drugs.  Outpatient Medications Prior to Visit  Medication Sig Dispense Refill  . docusate sodium (COLACE) 100 MG capsule Take 1 capsule (100 mg total) by mouth daily. 90 capsule 1  . gabapentin (NEURONTIN) 300 MG capsule Take 1 capsule (300 mg total) by mouth 2 (two) times daily. 60 capsule 2  . hydrocortisone (PROCTO-MED HC) 2.5 % rectal cream Place 1 application rectally 2 (two) times daily. 30 g 3  . meloxicam (MOBIC) 15 MG tablet Take 1 tablet (15 mg total) by mouth daily. 30 tablet 3  . Multiple Vitamin (MULTIVITAMIN) capsule Take 1 capsule by mouth daily.    . ondansetron (ZOFRAN) 4 MG tablet Take 1 tablet (4 mg total) by mouth every 8 (eight) hours as needed for nausea or vomiting. 20 tablet 0  . SAXENDA 18 MG/3ML SOPN     . Vitamin D, Ergocalciferol, (DRISDOL) 50000 units CAPS capsule TAKE 1 CAPSULE BY MOUTH EVERY 7 DAYS FOR 1 MONTH. THEN MONTHLY THEREAFTER 6 capsule 0  . Cyanocobalamin (VITAMIN B-12) 1000 MCG SUBL Place 1 tablet (1,000 mcg total) under the tongue daily. (Patient not taking: Reported on 11/10/2019) 30 tablet 11  . oseltamivir (TAMIFLU) 75 MG capsule Take 1 capsule (75 mg total) by mouth every 12 (twelve) hours. 10 capsule 0   No facility-administered medications prior to visit.     Review of Systems   Patient denies headache, fevers, malaise, unintentional weight loss, skin rash, eye pain, sinus congestion and sinus pain, sore throat, dysphagia,  hemoptysis , cough, dyspnea, wheezing, chest pain, palpitations, orthopnea, edema, , nausea, melena, diarrhea, constipation, flank pain, dysuria, hematuria, urinary  Frequency, nocturia, numbness, tingling, seizures,  Focal weakness,  Loss of consciousness,  Tremor, insomnia, depression, anxiety, and suicidal ideation.      Objective:  BP 124/70 (BP Location: Left Arm, Patient Position: Sitting, Cuff Size: Normal)   Pulse 98   Temp (!) 96.4 F (35.8 C) (Temporal)   Resp 14   Ht 5\' 6"  (1.676 m)   Wt 182 lb 6.4 oz (82.7 kg)   SpO2 96%   BMI 29.44 kg/m   Physical Exam   General appearance: alert, cooperative and appears stated age Head: Normocephalic, without obvious abnormality, atraumatic Eyes: conjunctivae/corneas clear. PERRL, EOM's intact. Fundi benign. Ears: normal TM's and external ear canals both ears Nose: Nares normal. Septum midline. Mucosa normal. No drainage or sinus tenderness. Throat: lips, mucosa, and tongue normal; teeth and gums normal Neck: no adenopathy, no carotid bruit, no JVD, supple, symmetrical, trachea midline and thyroid not enlarged, symmetric, no tenderness/mass/nodules Lungs: clear to auscultation bilaterally Breasts: normal appearance, no masses or tenderness Heart: regular rate and rhythm, S1, S2 normal, no murmur, click, rub or gallop Abdomen: soft, non-tender; bowel sounds normal; no masses,  no organomegaly Extremities: extremities normal, atraumatic, no cyanosis or edema Pulses: 2+ and symmetric Skin: Skin color, texture, turgor normal. No rashes or lesions Neurologic: Alert and oriented X 3, normal  strength and tone. Normal symmetric reflexes. Normal coordination and gait.      Assessment & Plan:   Problem List Items Addressed This Visit      Unprioritized   Encounter for general adult medical examination with abnormal findings    age appropriate education and counseling updated, referrals for preventative services and immunizations addressed, dietary and smoking counseling addressed, most recent labs reviewed.  I have personally reviewed and have noted:  1) the patient's medical and social history 2) The pt's use of alcohol, tobacco, and illicit drugs 3) The  patient's current medications and supplements 4) Functional ability including ADL's, fall risk, home safety risk, hearing and visual impairment 5) Diet and physical activities 6) Evidence for depression or mood disorder 7) The patient's height, weight, and BMI have been recorded in the chart  I have made referrals, and provided counseling and education based on review of the above      Impaired fasting glucose    a1c remains in normal range.        Vitamin B12 deficiency due to intestinal malabsorption   Postsurgical dumping syndrome    secondary to gastric bypass .  Dietary and lifestyle modifications discussed       Breast tenderness in female    Ultrasound was normal,  But she has no history of head and neck infection to result in LAD> Referring to Dr Bary Castilla for evaluation       Eczema of external ear    Mometasone ointment prescribed        Other Visit Diagnoses    Breast mass, right    -  Primary   Relevant Orders   Ambulatory referral to [facility]   Need for immunization against influenza       Relevant Orders   Flu Vaccine QUAD 36+ mos IM (Completed)      I have discontinued Geni Bers C. Cashman "Jackie"'s Vitamin B-12, Vitamin D (Ergocalciferol), and oseltamivir. I am also having her start on SYRINGE 3CC/25GX1", cyanocobalamin, ergocalciferol, and mometasone. Additionally, I am having her maintain her multivitamin, meloxicam, docusate sodium, hydrocortisone, ondansetron, gabapentin, and Saxenda. We administered cyanocobalamin.  Meds ordered this encounter  Medications  . Syringe/Needle, Disp, (SYRINGE 3CC/25GX1") 25G X 1" 3 ML MISC    Sig: Use for b12 injections    Dispense:  50 each    Refill:  0  . cyanocobalamin (,VITAMIN B-12,) 1000 MCG/ML injection    Sig: Inject 1 mL (1,000 mcg total) into the muscle 2 (two) times a week. For 2 weeks,  Then monthly thereafter    Dispense:  10 mL    Refill:  0  . ergocalciferol (DRISDOL) 1.25 MG (50000 UT) capsule     Sig: Take 1 capsule (50,000 Units total) by mouth 2 (two) times a week. For one month,  Then weekly thereafter    Dispense:  8 capsule    Refill:  0    Gastric bypass induced vitamin d deficiency  . mometasone (ELOCON) 0.1 % cream    Sig: Apply 1 application topically daily.    Dispense:  45 g    Refill:  0  . cyanocobalamin ((VITAMIN B-12)) injection 1,000 mcg    Medications Discontinued During This Encounter  Medication Reason  . oseltamivir (TAMIFLU) 75 MG capsule Completed Course  . Cyanocobalamin (VITAMIN B-12) 1000 MCG SUBL   . Vitamin D, Ergocalciferol, (DRISDOL) 50000 units CAPS capsule     Follow-up: No follow-ups on file.   Crecencio Mc, MD

## 2019-11-12 DIAGNOSIS — N644 Mastodynia: Secondary | ICD-10-CM | POA: Insufficient documentation

## 2019-11-12 DIAGNOSIS — K911 Postgastric surgery syndromes: Secondary | ICD-10-CM | POA: Insufficient documentation

## 2019-11-12 DIAGNOSIS — H60549 Acute eczematoid otitis externa, unspecified ear: Secondary | ICD-10-CM | POA: Insufficient documentation

## 2019-11-12 NOTE — Assessment & Plan Note (Signed)
a1c remains in normal range.

## 2019-11-12 NOTE — Assessment & Plan Note (Signed)
Mometasone ointment prescribed  

## 2019-11-12 NOTE — Assessment & Plan Note (Signed)
secondary to gastric bypass .  Dietary and lifestyle modifications discussed

## 2019-11-12 NOTE — Assessment & Plan Note (Signed)

## 2019-11-12 NOTE — Assessment & Plan Note (Signed)
Ultrasound was normal,  But she has no history of head and neck infection to result in LAD> Referring to Dr Bary Castilla for evaluation

## 2019-11-21 ENCOUNTER — Other Ambulatory Visit: Payer: Self-pay

## 2019-11-21 DIAGNOSIS — Z20822 Contact with and (suspected) exposure to covid-19: Secondary | ICD-10-CM

## 2019-11-23 LAB — NOVEL CORONAVIRUS, NAA: SARS-CoV-2, NAA: DETECTED — AB

## 2019-11-25 ENCOUNTER — Other Ambulatory Visit: Payer: Self-pay

## 2019-11-25 ENCOUNTER — Ambulatory Visit: Payer: Self-pay | Admitting: *Deleted

## 2019-11-25 ENCOUNTER — Emergency Department
Admission: EM | Admit: 2019-11-25 | Discharge: 2019-11-25 | Disposition: A | Discharge status: Home / Self Care | Payer: BC Managed Care – PPO | Attending: Emergency Medicine | Admitting: Emergency Medicine

## 2019-11-25 DIAGNOSIS — U071 COVID-19: Secondary | ICD-10-CM | POA: Insufficient documentation

## 2019-11-25 DIAGNOSIS — R519 Headache, unspecified: Secondary | ICD-10-CM | POA: Diagnosis present

## 2019-11-25 LAB — URINALYSIS, COMPLETE (UACMP) WITH MICROSCOPIC
Bacteria, UA: NONE SEEN
Bilirubin Urine: NEGATIVE
Glucose, UA: NEGATIVE mg/dL
Ketones, ur: 20 mg/dL — AB
Leukocytes,Ua: NEGATIVE
Nitrite: NEGATIVE
Protein, ur: 30 mg/dL — AB
Specific Gravity, Urine: 1.029 (ref 1.005–1.030)
pH: 5 (ref 5.0–8.0)

## 2019-11-25 LAB — CBC
HCT: 37.9 % (ref 36.0–46.0)
Hemoglobin: 13 g/dL (ref 12.0–15.0)
MCH: 29.5 pg (ref 26.0–34.0)
MCHC: 34.3 g/dL (ref 30.0–36.0)
MCV: 85.9 fL (ref 80.0–100.0)
Platelets: 177 10*3/uL (ref 150–400)
RBC: 4.41 MIL/uL (ref 3.87–5.11)
RDW: 12.2 % (ref 11.5–15.5)
WBC: 4.5 10*3/uL (ref 4.0–10.5)
nRBC: 0 % (ref 0.0–0.2)

## 2019-11-25 LAB — COMPREHENSIVE METABOLIC PANEL
ALT: 14 U/L (ref 0–44)
AST: 20 U/L (ref 15–41)
Albumin: 4.1 g/dL (ref 3.5–5.0)
Alkaline Phosphatase: 69 U/L (ref 38–126)
Anion gap: 9 (ref 5–15)
BUN: 11 mg/dL (ref 6–20)
CO2: 26 mmol/L (ref 22–32)
Calcium: 8.6 mg/dL — ABNORMAL LOW (ref 8.9–10.3)
Chloride: 104 mmol/L (ref 98–111)
Creatinine, Ser: 0.51 mg/dL (ref 0.44–1.00)
GFR calc Af Amer: >60 mL/min (ref >60)
GFR calc non Af Amer: >60 mL/min (ref >60)
Glucose, Bld: 99 mg/dL (ref 70–99)
Potassium: 4 mmol/L (ref 3.5–5.1)
Sodium: 139 mmol/L (ref 135–145)
Total Bilirubin: 0.6 mg/dL (ref 0.3–1.2)
Total Protein: 7.6 g/dL (ref 6.5–8.1)

## 2019-11-25 LAB — LIPASE, BLOOD: Lipase: 37 U/L (ref 11–51)

## 2019-11-25 MED ORDER — SODIUM CHLORIDE 0.9% FLUSH: 3.0000 mL | Freq: Once | INTRAVENOUS | Status: DC

## 2019-11-25 NOTE — Telephone Encounter (Signed)
Agree with advice.  History concerning for diveticulitis , needs CT scan

## 2019-11-25 NOTE — ED Provider Notes (Cosign Needed)
[facility] Emergency Department Provider Note  ____________________________________________  Time seen: Approximately 5:25 PM  The following is a medical screening exam note. It is intended that the patient await an ER room assignment for detailed exam, diagnosis, and disposition.  I have reviewed the triage vital signs.    HISTORY  Chief Complaint Abdominal Pain   HPI Kelly [name] is a 53 y.o. female that is Covid positive that presents to the emergency department for evaluation of headache and abdominal pain today. Patient tested positive for Covid 2 days ago. She gets SOB when she she moves around. She is able to drink fluids.   Past Medical History:  Diagnosis Date  . Chest pain 2002   abnormal EKG, negative treadmill stress test/Holter myoview, Dr. Humphrey Rolls    Patient Active Problem List   Diagnosis Date Noted  . Postsurgical dumping syndrome 11/12/2019  . Breast tenderness in female 11/12/2019  . Eczema of external ear 11/12/2019  . Bilateral hand pain 01/04/2019  . Impingement syndrome of shoulder region 10/16/2017  . Osteoarthritis of knee 10/16/2017  . Knee pain, bilateral 10/03/2017  . Insomnia 10/03/2017  . Chest pain at rest 07/23/2017  . Protein-calorie malnutrition ([place]) 11/06/2016  . Vitamin B12 deficiency due to intestinal malabsorption 11/06/2016  . Vitamin D deficiency 11/06/2016  . S/P bariatric surgery 11/06/2016  . Abdominal pain, RUQ 03/25/2016  . Postoperative malabsorption 03/04/2016  . Back pain 01/16/2016  . Impaired fasting glucose 06/18/2015  . GERD (gastroesophageal reflux disease) 06/17/2015  . Lumbar spine strain, initial encounter 05/30/2015  . Spondylosis of lumbar region without myelopathy or radiculopathy 05/30/2015  . Essential hypertension 10/21/2014  . Abnormal involuntary movements 10/21/2014  . Aural vertigo 10/18/2014  . Encounter for general adult medical examination with abnormal findings  09/19/2014  . S/P hysterectomy with oophorectomy 09/19/2014  . Absence of both cervix and uterus, acquired 09/19/2014  . Generalized anxiety disorder 09/18/2014  . Snoring 09/18/2014  . Abnormal respiratory rate 09/18/2014  . Irritable bowel syndrome 11/11/2013  . Shoulder pain, right 11/11/2013  . Orthostatic hypotension 03/14/2013  . Menopause ovarian failure 09/13/2012  . Major depressive disorder, recurrent episode with anxious distress ([place]) 07/27/2012  . Migraine headache without aura 07/27/2012  . Other chronic allergic conjunctivitis 07/27/2012  . Sciatica of left side 01/08/2012    Past Surgical History:  Procedure Laterality Date  . ABDOMINAL HYSTERECTOMY  35   secondary to post partum hemorrhage causing anemia  . GASTRIC BYPASS  2016  . SPINE SURGERY     lumbar spine, L4-L5 ruptured disc    Prior to Admission medications   Medication Sig Start Date End Date Taking? Authorizing Provider  cyanocobalamin (,VITAMIN B-12,) 1000 MCG/ML injection Inject 1 mL (1,000 mcg total) into the muscle 2 (two) times a week. For 2 weeks,  Then monthly thereafter 11/10/19   Crecencio Mc, MD  docusate sodium (COLACE) 100 MG capsule Take 1 capsule (100 mg total) by mouth daily. 05/18/19   Jodelle Green, FNP  ergocalciferol (DRISDOL) 1.25 MG (50000 UT) capsule Take 1 capsule (50,000 Units total) by mouth 2 (two) times a week. For one month,  Then weekly thereafter 11/10/19   Crecencio Mc, MD  gabapentin (NEURONTIN) 300 MG capsule Take 1 capsule (300 mg total) by mouth 2 (two) times daily. 06/15/19   Jodelle Green, FNP  hydrocortisone (PROCTO-MED HC) 2.5 % rectal cream Place 1 application rectally 2 (two) times daily. 05/18/19   Jodelle Green, FNP  meloxicam (MOBIC) 15 MG tablet Take 1 tablet (15 mg total) by mouth daily. 02/07/19   Hyatt, Max T, DPM  mometasone (ELOCON) 0.1 % cream Apply 1 application topically daily. 11/10/19   [name], MD  Multiple Vitamin (MULTIVITAMIN) capsule  Take 1 capsule by mouth daily.    [provider]  ondansetron (ZOFRAN) 4 MG tablet Take 1 tablet (4 mg total) by mouth every 8 (eight) hours as needed for nausea or vomiting. 06/01/19   Elinor Parkinson, DPM  SAXENDA 18 MG/3ML SOPN  10/30/19   [provider]  Syringe/Needle, Disp, (SYRINGE 3CC/25GX1") 25G X 1" 3 ML MISC Use for b12 injections 11/10/19   [name], MD    Allergies Latex and Codeine  Family History  Problem Relation Age of Onset  . Hyperlipidemia Mother   . Arthritis Mother   . Hypertension Mother   . Hypertension Father   . Heart disease Father   . Heart disease Maternal Uncle   . Coronary artery disease Maternal Uncle   . Diabetes Paternal Aunt   . Coronary artery disease Maternal Grandfather   . Heart attack Maternal Grandfather   . Breast cancer Maternal Aunt 45  . Colon cancer Maternal Grandmother   . Heart disease Paternal Grandmother   . Diabetes Brother   . Hyperlipidemia Brother   . Hypertension Brother   . Heart disease Brother     Social History Social History   Tobacco Use  . Smoking status: Never Smoker  . Smokeless tobacco: Never Used  Substance Use Topics  . Alcohol use: Not Currently    Alcohol/week: 0.0 standard drinks    Frequency: Never  . Drug use: No    Review of Systems Respiratory: Positive for cough and SOB with exertion. Gastrointestinal: Positive for abdominal pain..  No nausea, no vomiting.  No diarrhea.  Skin: Negative for rash/lesion/wound. Neurological: Negative for focal weakness or numbness. Positive for headache.  ____________________________________________   PHYSICAL EXAM:  VITAL SIGNS:  Vitals:   11/25/19 1718  BP: (!) 126/92  Pulse: 92  Resp: 18  Temp: 98.1 F (36.7 C)  SpO2: 100%    Constitutional: Alert and oriented. No acute distress. Head: Atraumatic. Nose: No congestion/rhinnorhea. Mouth/Throat: Mucous membranes are moist. Neck: No stridor.  Cardiovascular: Good  peripheral circulation. Respiratory: Normal respiratory effort. Musculoskeletal: No restriction Neurologic:  Normal speech and language. No gross focal neurologic deficits are appreciated. Speech is normal. No gait instability. Skin:  Skin is warm, dry and intact. No rash noted. Psychiatric: Mood and affect are normal. Speech and behavior are normal.  ____________________________________________   LABS (all labs ordered are listed, but only abnormal results are displayed)  Labs Reviewed  LIPASE, BLOOD  COMPREHENSIVE METABOLIC PANEL  CBC  URINALYSIS, COMPLETE (UACMP) WITH MICROSCOPIC   ____________________________________________    INITIAL CLINICAL IMPRESSION(S)   Patient is well appearing and non toxic. Basic labs ordered.   Enid Derry, PA-C 11/25/19 1728

## 2019-11-25 NOTE — ED Notes (Signed)
Pt to front desk stating that she was going to go home and lay down. Will return if her symptoms get worse. Pt is in NAD.

## 2019-11-25 NOTE — Telephone Encounter (Signed)
Pt called with complaints of abdominal pain the that started around 0930 11/25/2019; she says it is in her lower abdomen and predominately on left; she says it is "a stabbing cramp feeling" rated 9 out of 10 and is now constant; the pt is +COVID; recommendations made per nurse triage protocol; pt also instructed that she and her driver should wear masks that cover their noses and mouths, and she should let the facility know that she is +COVID; she verbalized understanding; the pt sees Dr Derrel Nip, [facility]; will route to office for notification.     Reason for Disposition . [1] SEVERE pain (e.g., excruciating) AND [2] present > 1 hour  Answer Assessment - Initial Assessment Questions 1. LOCATION: "Where does it hurt?"      Lower abdomen predominately on the left 2. RADIATION: "Does the pain shoot anywhere else?" (e.g., chest, back)     no 3. ONSET: "When did the pain begin?" (e.g., minutes, hours or days ago)   0930 started as mild stomach cramps but have gradually worsened 4. SUDDEN: "Gradual or sudden onset?"    gradually 5. PATTERN "Does the pain come and go, or is it constant?"    - If constant: "Is it getting better, staying the same, or worsening?"      (Note: Constant means the pain never goes away completely; most serious pain is constant and it progresses)     - If intermittent: "How long does it last?" "Do you have pain now?"     (Note: Intermittent means the pain goes away completely between bouts)     constant 6. SEVERITY: "How bad is the pain?"  (e.g., Scale 1-10; mild, moderate, or severe)   - MILD (1-3): doesn't interfere with normal activities, abdomen soft and not tender to touch    - MODERATE (4-7): interferes with normal activities or awakens from sleep, tender to touch    - SEVERE (8-10): excruciating pain, doubled over, unable to do any normal activities      9 out of 10 7. RECURRENT SYMPTOM: "Have you ever had this type of abdominal pain before?" If so, ask: "When was  the last time?" and "What happened that time?"      no 8. CAUSE: "What do you think is causing the abdominal pain?"   Not sure 9. RELIEVING/AGGRAVATING FACTORS: "What makes it better or worse?" (e.g., movement, antacids, bowel movement)     Nothing makes better or worse 10. OTHER SYMPTOMS: "Has there been any vomiting, diarrhea, constipation, or urine problems?"       Constipation for the last 2 days then to watery diarrhea x 2 episodes on 11/25/2019; headache rated 7 out of 10 (took ES Tylenol 1500 mg twice daily for 2-3 days) 11. PREGNANCY: "Is there any chance you are pregnant?" "When was your last menstrual period?"       No hysterectomy  Protocols used: ABDOMINAL PAIN - [facility]

## 2019-11-25 NOTE — ED Triage Notes (Signed)
Pt comes via pOV from home with c/o abdominal pain and headache. Pt states Wednesday she was dx with COVID+  Pt states diarrhea. Pt states she is able to keep down fluids.

## 2019-11-28 ENCOUNTER — Telehealth: Payer: Self-pay | Admitting: Emergency Medicine

## 2019-11-28 NOTE — Telephone Encounter (Signed)
Called patient due to lwot to inquire about condition and follow up plans. Left message.   

## 2019-11-30 ENCOUNTER — Other Ambulatory Visit: Payer: Self-pay | Admitting: *Deleted

## 2019-11-30 ENCOUNTER — Other Ambulatory Visit: Payer: Self-pay | Admitting: Internal Medicine

## 2019-11-30 DIAGNOSIS — Z20822 Contact with and (suspected) exposure to covid-19: Secondary | ICD-10-CM

## 2019-12-03 LAB — NOVEL CORONAVIRUS, NAA

## 2019-12-04 NOTE — Progress Notes (Signed)
Spoke with patient she stated she was tested 2 weeks ago and needed to be retested for work. She stated at this time she has no symptoms and would be returning to work this week she was told by the health department that she could return to work.

## 2019-12-06 ENCOUNTER — Other Ambulatory Visit: Payer: Self-pay | Admitting: Internal Medicine

## 2019-12-06 NOTE — Telephone Encounter (Signed)
Refilled: 11/10/2019 Last OV: 11/10/2019 Next OV: 05/10/2020

## 2019-12-21 ENCOUNTER — Other Ambulatory Visit: Payer: Self-pay | Admitting: Internal Medicine

## 2020-01-05 ENCOUNTER — Other Ambulatory Visit: Payer: Self-pay | Admitting: Internal Medicine

## 2020-05-08 ENCOUNTER — Other Ambulatory Visit: Payer: Self-pay

## 2020-05-10 ENCOUNTER — Encounter: Payer: Self-pay | Admitting: Internal Medicine

## 2020-05-10 ENCOUNTER — Ambulatory Visit (INDEPENDENT_AMBULATORY_CARE_PROVIDER_SITE_OTHER): Payer: BC Managed Care – PPO | Admitting: Internal Medicine

## 2020-05-10 ENCOUNTER — Other Ambulatory Visit: Payer: Self-pay

## 2020-05-10 VITALS — BP 124/82 | HR 77 | Temp 98.0°F | Resp 14 | Ht 65.0 in | Wt 176.6 lb

## 2020-05-10 DIAGNOSIS — R7301 Impaired fasting glucose: Secondary | ICD-10-CM

## 2020-05-10 DIAGNOSIS — E559 Vitamin D deficiency, unspecified: Secondary | ICD-10-CM

## 2020-05-10 DIAGNOSIS — K909 Intestinal malabsorption, unspecified: Secondary | ICD-10-CM

## 2020-05-10 DIAGNOSIS — R609 Edema, unspecified: Secondary | ICD-10-CM | POA: Diagnosis not present

## 2020-05-10 DIAGNOSIS — E538 Deficiency of other specified B group vitamins: Secondary | ICD-10-CM

## 2020-05-10 DIAGNOSIS — R600 Localized edema: Secondary | ICD-10-CM | POA: Diagnosis not present

## 2020-05-10 LAB — COMPREHENSIVE METABOLIC PANEL
ALT: 16 U/L (ref 0–35)
AST: 18 U/L (ref 0–37)
Albumin: 4 g/dL (ref 3.5–5.2)
Alkaline Phosphatase: 68 U/L (ref 39–117)
BUN: 8 mg/dL (ref 6–23)
CO2: 29 mEq/L (ref 19–32)
Calcium: 8.9 mg/dL (ref 8.4–10.5)
Chloride: 106 mEq/L (ref 96–112)
Creatinine, Ser: 0.61 mg/dL (ref 0.40–1.20)
GFR: 123.77 mL/min (ref >60.00)
Glucose, Bld: 107 mg/dL — ABNORMAL HIGH (ref 70–99)
Potassium: 4.5 mEq/L (ref 3.5–5.1)
Sodium: 138 mEq/L (ref 135–145)
Total Bilirubin: 0.3 mg/dL (ref 0.2–1.2)
Total Protein: 6.9 g/dL (ref 6.0–8.3)

## 2020-05-10 LAB — LIPID PANEL
Cholesterol: 195 mg/dL (ref 0–200)
HDL: 79.7 mg/dL (ref >39.00)
LDL Cholesterol: 99 mg/dL (ref 0–99)
NonHDL: 115.17
Total CHOL/HDL Ratio: 2
Triglycerides: 79 mg/dL (ref 0.0–149.0)
VLDL: 15.8 mg/dL (ref 0.0–40.0)

## 2020-05-10 LAB — HEMOGLOBIN A1C: Hgb A1c MFr Bld: 5.3 % (ref 4.6–6.5)

## 2020-05-10 LAB — VITAMIN D 25 HYDROXY (VIT D DEFICIENCY, FRACTURES): VITD: 14.7 ng/mL — ABNORMAL LOW (ref 30.00–100.00)

## 2020-05-10 LAB — MICROALBUMIN / CREATININE URINE RATIO
Creatinine,U: 141.6 mg/dL
Microalb Creat Ratio: 0.7 mg/g (ref 0.0–30.0)
Microalb, Ur: 1 mg/dL (ref 0.0–1.9)

## 2020-05-10 LAB — TSH: TSH: 1.28 u[IU]/mL (ref 0.35–4.50)

## 2020-05-10 MED ORDER — TRAZODONE HCL 50 MG PO TABS: 25.0000 mg | ORAL_TABLET | Freq: Every evening | ORAL | 3 refills | Status: DC | PRN

## 2020-05-10 MED ORDER — FUROSEMIDE 20 MG PO TABS: 20.0000 mg | ORAL_TABLET | ORAL | 0 refills | Status: DC | PRN

## 2020-05-10 NOTE — Patient Instructions (Addendum)
Your fluid retention is most likely due to prolonged sitting without using your calf muscles.  This causes venous insufficiency  I recommend using compression socks during your driving periods.  Try [employer] for compression knee highs, socks etc'  wear on days of travel and take off  If your prefer once inside   Use the furosemde not more than once per week for severe swelling

## 2020-05-10 NOTE — Progress Notes (Signed)
Subjective:  Patient ID: Kelly [name], female    DOB: 1966/02/06  Age: 54 y.o. MRN: [id number]  CC: The primary encounter diagnosis was Edema, unspecified type. Diagnoses of Impaired fasting glucose, Vitamin D deficiency, Fluid retention in legs, and Vitamin B12 deficiency due to intestinal malabsorption were also pertinent to this visit.  HPI [name] presents for follow up on chronic issues including morbid obesity s/p gastric bypass surgery. Last seen 6 months ago.    This visit occurred during the SARS-CoV-2 public health emergency.  Safety protocols were in place, including screening questions prior to the visit, additional usage of staff PPE, and extensive cleaning of exam room while observing appropriate contact time as indicated for disinfecting solutions.    Patient has received both doses of the available COVID 19 vaccine without complications.  Patient continues to mask when outside of the home except when walking in yard or at safe distances from others .  Patient denies any change in mood or development of unhealthy behaviors resuting from the pandemic's restriction of activities and socialization.    Cc: fluid retention.  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  Does not have fluid retention on the weekends.  hctz was stopped in Feb 2020 due to orthostasis and bp has remained WNL  Obesity:  Patient underwent gastric bypass surgery in 2016, top weight at  In office was 242 lbs  And reached a nadir of 145 in 2018 (BMI 24) .  She regained 40 lbs but has recently lost 10 lbs with the use of Saxenda , prescribed by the bariatric clinic.   She notes that the post prandial nausea she was having has resolved since starting Saxenda   Vit d deficiency taking 50K ius twice weekly   Last dose was last month   Outpatient Medications Prior to Visit  Medication Sig Dispense Refill  . BD PEN NEEDLE NANO 2ND GEN 32G X 4 MM MISC     . docusate sodium  (COLACE) 100 MG capsule Take 1 capsule (100 mg total) by mouth daily. 90 capsule 1  . gabapentin (NEURONTIN) 300 MG capsule Take 1 capsule (300 mg total) by mouth 2 (two) times daily. 60 capsule 2  . hydrocortisone (PROCTO-MED HC) 2.5 % rectal cream Place 1 application rectally 2 (two) times daily. 30 g 3  . LINZESS 145 MCG CAPS capsule Take 145 mcg by mouth daily.    . meloxicam (MOBIC) 15 MG tablet Take 1 tablet (15 mg total) by mouth daily. 30 tablet 3  . mometasone (ELOCON) 0.1 % cream APPLY TO AFFECTED AREA EVERY DAY 45 g 0  . Multiple Vitamin (MULTIVITAMIN) capsule Take 1 capsule by mouth daily.    . ondansetron (ZOFRAN) 4 MG tablet Take 1 tablet (4 mg total) by mouth every 8 (eight) hours as needed for nausea or vomiting. 20 tablet 0  . SAXENDA 18 MG/3ML SOPN     . vitamin B-12 (CYANOCOBALAMIN) 1000 MCG tablet Take 1,000 mcg by mouth daily.    . cyanocobalamin (,VITAMIN B-12,) 1000 MCG/ML injection Inject 1 mL (1,000 mcg total) into the muscle 2 (two) times a week. For 2 weeks,  Then monthly thereafter 10 mL 0  . Syringe/Needle, Disp, (SYRINGE 3CC/25GX1") 25G X 1" 3 ML MISC Use for b12 injections (Patient not taking: Reported on 05/10/2020) 50 each 0  . Vitamin D, Ergocalciferol, (DRISDOL) 1.25 MG (50000 UNIT) CAPS capsule TAKE 1 CAPSULE BY MOUTH 2 (TWO)  TIMES A WEEK. FOR ONE MONTH, THEN WEEKLY THEREAFTER (Patient not taking: Reported on 05/10/2020) 24 capsule 1   No facility-administered medications prior to visit.    Review of Systems;  Patient denies headache, fevers, malaise, unintentional weight loss, skin rash, eye pain, sinus congestion and sinus pain, sore throat, dysphagia,  hemoptysis , cough, dyspnea, wheezing, chest pain, palpitations, orthopnea, edema, abdominal pain, nausea, melena, diarrhea, constipation, flank pain, dysuria, hematuria, urinary  Frequency, nocturia, numbness, tingling, seizures,  Focal weakness, Loss of consciousness,  Tremor, insomnia, depression, anxiety, and  suicidal ideation.      Objective:  BP 124/82 (BP Location: Left Arm, Patient Position: Sitting, Cuff Size: Normal)   Pulse 77   Temp 98 F (36.7 C) (Temporal)   Resp 14   Ht 5\' 5"  (1.651 m)   Wt 176 lb 9.6 oz (80.1 kg)   SpO2 98%   BMI 29.39 kg/m   BP Readings from Last 3 Encounters:  05/10/20 124/82  11/10/19 124/70  05/18/19 130/62    Wt Readings from Last 3 Encounters:  05/10/20 176 lb 9.6 oz (80.1 kg)  11/10/19 182 lb 6.4 oz (82.7 kg)  05/18/19 173 lb (78.5 kg)    General appearance: alert, cooperative and appears stated age Ears: normal TM's and external ear canals both ears Throat: lips, mucosa, and tongue normal; teeth and gums normal Neck: no adenopathy, no carotid bruit, supple, symmetrical, trachea midline and thyroid not enlarged, symmetric, no tenderness/mass/nodules Back: symmetric, no curvature. ROM normal. No CVA tenderness. Lungs: clear to auscultation bilaterally Heart: regular rate and rhythm, S1, S2 normal, no murmur, click, rub or gallop Abdomen: soft, non-tender; bowel sounds normal; no masses,  no organomegaly Pulses: 2+ and symmetric Skin: Skin color, texture, turgor normal. No rashes or lesions Lymph nodes: Cervical, supraclavicular, and axillary nodes normal.  Lab Results  Component Value Date   HGBA1C 5.3 05/10/2020   HGBA1C 5.6 11/02/2019   HGBA1C 5.2 07/23/2017    Lab Results  Component Value Date   CREATININE 0.61 05/10/2020   CREATININE 0.51 11/25/2019   CREATININE 0.61 07/23/2017    Lab Results  Component Value Date   WBC 4.5 11/25/2019   HGB 13.0 11/25/2019   HCT 37.9 11/25/2019   PLT 177 11/25/2019   GLUCOSE 107 (H) 05/10/2020   CHOL 195 05/10/2020   TRIG 79.0 05/10/2020   HDL 79.70 05/10/2020   LDLCALC 99 05/10/2020   ALT 16 05/10/2020   AST 18 05/10/2020   NA 138 05/10/2020   K 4.5 05/10/2020   CL 106 05/10/2020   CREATININE 0.61 05/10/2020   BUN 8 05/10/2020   CO2 29 05/10/2020   TSH 1.28 05/10/2020    HGBA1C 5.3 05/10/2020   MICROALBUR 1.0 05/10/2020     Assessment & Plan:   Problem List Items Addressed This Visit      Unprioritized   Fluid retention in legs    None on exam today.  History suggests venous insufficiency aggravated by positioning  . Recommend use of compression stockings daily.  Furosemide prn, not more than once weekly       Impaired fasting glucose    a1c has been lowered with initiation of Saxenda  And is now in normal range.   Lab Results  Component Value Date   HGBA1C 5.3 05/10/2020         Relevant Orders   Hemoglobin A1c (Completed)   Lipid panel (Completed)   Vitamin B12 deficiency due to intestinal malabsorption    Continue  monthly injections for impaired absorption secondary to bariatric surgery   Lab Results  Component Value Date   VITAMINB12 236 07/23/2017         Vitamin D deficiency   Relevant Orders   VITAMIN D 25 Hydroxy (Vit-D Deficiency, Fractures) (Completed)    Other Visit Diagnoses    Edema, unspecified type    -  Primary   Relevant Orders   TSH (Completed)   Comprehensive metabolic panel (Completed)   Microalbumin / creatinine urine ratio (Completed)      I have discontinued Jacari C. Arvidson "Jackie"'s SYRINGE 3CC/25GX1", cyanocobalamin, and Vitamin D (Ergocalciferol). I am also having her start on traZODone and furosemide. Additionally, I am having her maintain her multivitamin, meloxicam, docusate sodium, hydrocortisone, ondansetron, gabapentin, Saxenda, mometasone, vitamin B-12, BD Pen Needle Nano 2nd Gen, and Linzess.  Meds ordered this encounter  Medications  . traZODone (DESYREL) 50 MG tablet    Sig: Take 0.5-1 tablets (25-50 mg total) by mouth at bedtime as needed for sleep.    Dispense:  30 tablet    Refill:  3  . furosemide (LASIX) 20 MG tablet    Sig: Take 1 tablet (20 mg total) by mouth as needed.    Dispense:  30 tablet    Refill:  0    Medications Discontinued During This Encounter  Medication  Reason  . cyanocobalamin (,VITAMIN B-12,) 1000 MCG/ML injection Completed Course  . Syringe/Needle, Disp, (SYRINGE 3CC/25GX1") 25G X 1" 3 ML MISC Completed Course  . Vitamin D, Ergocalciferol, (DRISDOL) 1.25 MG (50000 UNIT) CAPS capsule Completed Course   I provided 25 minutes of  face-to-face time during this encounter reviewing patient's current problems and past surgeries, labs and imaging studies, providing counseling on the above mentioned problems , and coordination  of care .  Follow-up: No follow-ups on file.   [name], MD

## 2020-05-12 ENCOUNTER — Other Ambulatory Visit: Payer: Self-pay | Admitting: Internal Medicine

## 2020-05-12 DIAGNOSIS — R600 Localized edema: Secondary | ICD-10-CM | POA: Insufficient documentation

## 2020-05-12 MED ORDER — ERGOCALCIFEROL 1.25 MG (50000 UT) PO CAPS: 50000.0000 [IU] | ORAL_CAPSULE | ORAL | 3 refills | Status: DC

## 2020-05-12 NOTE — Assessment & Plan Note (Signed)
Continue monthly injections for impaired absorption secondary to bariatric surgery   Lab Results  Component Value Date   VITAMINB12 236 07/23/2017

## 2020-05-12 NOTE — Assessment & Plan Note (Signed)
None on exam today.  History suggests venous insufficiency aggravated by positioning  . Recommend use of compression stockings daily.  Furosemide prn, not more than once weekly

## 2020-05-12 NOTE — Assessment & Plan Note (Signed)
a1c has been lowered with initiation of Saxenda  And is now in normal range.   Lab Results  Component Value Date   HGBA1C 5.3 05/10/2020

## 2020-08-07 ENCOUNTER — Other Ambulatory Visit: Payer: Self-pay

## 2020-08-07 ENCOUNTER — Ambulatory Visit
Admission: EM | Admit: 2020-08-07 | Discharge: 2020-08-07 | Disposition: A | Discharge status: Home / Self Care | Payer: BC Managed Care – PPO

## 2020-08-07 DIAGNOSIS — T300 Burn of unspecified body region, unspecified degree: Secondary | ICD-10-CM

## 2020-08-07 DIAGNOSIS — M79604 Pain in right leg: Secondary | ICD-10-CM | POA: Diagnosis not present

## 2020-08-07 MED ORDER — CEPHALEXIN 500 MG PO CAPS: 500.0000 mg | ORAL_CAPSULE | Freq: Two times a day (BID) | ORAL | 0 refills | Status: AC

## 2020-08-07 MED ORDER — FLUCONAZOLE 200 MG PO TABS: 200.0000 mg | ORAL_TABLET | Freq: Once | ORAL | 0 refills | Status: AC

## 2020-08-07 NOTE — Discharge Instructions (Addendum)
I have sent in Keflex for you to take for the next 7 days, one tablet twice a day  I have also sent in diflucan in case of yeast. Take one tablet at the onset of symptoms, and another tablet in 3 days if symptoms are still present  Follow up as needed
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
## 2020-08-09 ENCOUNTER — Ambulatory Visit: Payer: BC Managed Care – PPO | Admitting: Nurse Practitioner

## 2020-09-20 LAB — HM COLONOSCOPY

## 2021-01-15 ENCOUNTER — Encounter: Payer: Self-pay | Admitting: *Deleted

## 2021-05-23 ENCOUNTER — Other Ambulatory Visit: Payer: Self-pay

## 2021-05-23 ENCOUNTER — Ambulatory Visit
Admission: EM | Admit: 2021-05-23 | Discharge: 2021-05-23 | Disposition: A | Discharge status: Home / Self Care | Payer: BC Managed Care – PPO | Attending: Emergency Medicine | Admitting: Emergency Medicine

## 2021-05-23 ENCOUNTER — Encounter: Payer: Self-pay | Admitting: Emergency Medicine

## 2021-05-23 DIAGNOSIS — H60503 Unspecified acute noninfective otitis externa, bilateral: Secondary | ICD-10-CM | POA: Diagnosis not present

## 2021-05-23 MED ORDER — CIPROFLOXACIN-DEXAMETHASONE 0.3-0.1 % OT SUSP: 4.0000 [drp] | Freq: Two times a day (BID) | OTIC | 0 refills | Status: DC

## 2021-05-23 NOTE — ED Triage Notes (Signed)
Pt is present today with bilateral ear pain and a sore throat. Pt states that her sx started Monday

## 2021-05-23 NOTE — ED Provider Notes (Signed)
HPI  SUBJECTIVE:  Kelly [name] is a 55 y.o. female who presents with 3 days of bilateral ear pain worse on the left, decreased hearing.  She reports popping and clicking in her ears and states that her allergies are bothering her.  She reports pain with chewing and admits to wearing ear buds and inserting Q-tips into her ears.  No recent swimming, fevers, nasal congestion, rhinorrhea, otorrhea.  She does not grind her teeth at night.  No vertigo.  She was on Augmentin recently for a "clogged tear duct".  No antipyretic in the past 6 hours.  She has tried irrigating her ears with hydrogen peroxide and warm water without improvement in her symptoms.  Symptoms are worse with chewing and yawning.  She states that her ear canal feels swollen.  She is status post gastric bypass and has a history of hypertension.  No history of frequent otitis media, diabetes.  PMD: Dr. Darrick Huntsman    Past Medical History:  Diagnosis Date  . Chest pain 2002   abnormal EKG, negative treadmill stress test/Holter myoview, Dr. Welton Flakes    Past Surgical History:  Procedure Laterality Date  . ABDOMINAL HYSTERECTOMY  35   secondary to post partum hemorrhage causing anemia  . GASTRIC BYPASS  2016  . SPINE SURGERY     lumbar spine, L4-L5 ruptured disc    Family History  Problem Relation Age of Onset  . Hyperlipidemia Mother   . Arthritis Mother   . Hypertension Mother   . Hypertension Father   . Heart disease Father   . Heart disease Maternal Uncle   . Coronary artery disease Maternal Uncle   . Diabetes Paternal Aunt   . Coronary artery disease Maternal Grandfather   . Heart attack Maternal Grandfather   . Breast cancer Maternal Aunt 45  . Colon cancer Maternal Grandmother   . Heart disease Paternal Grandmother   . Diabetes Brother   . Hyperlipidemia Brother   . Hypertension Brother   . Heart disease Brother     Social History   Tobacco Use  . Smoking status: Never Smoker  . Smokeless tobacco: Never Used   Vaping Use  . Vaping Use: Former  . Quit date: 07/23/2016  Substance Use Topics  . Alcohol use: Not Currently    Alcohol/week: 0.0 standard drinks  . Drug use: No    No current facility-administered medications for this encounter.  Current Outpatient Medications:  .  ciprofloxacin-dexamethasone (CIPRODEX) OTIC suspension, Place 4 drops into both ears 2 (two) times daily. X 7 days, Disp: 7.5 mL, Rfl: 0 .  BD PEN NEEDLE NANO 2ND GEN 32G X 4 MM MISC, , Disp: , Rfl:  .  ergocalciferol (DRISDOL) 1.25 MG (50000 UT) capsule, Take 1 capsule (50,000 Units total) by mouth once a week., Disp: 12 capsule, Rfl: 3 .  gabapentin (NEURONTIN) 300 MG capsule, Take 1 capsule (300 mg total) by mouth 2 (two) times daily., Disp: 60 capsule, Rfl: 2 .  LINZESS 145 MCG CAPS capsule, Take 145 mcg by mouth daily., Disp: , Rfl:  .  Multiple Vitamin (MULTIVITAMIN) capsule, Take 1 capsule by mouth daily., Disp: , Rfl:  .  SAXENDA 18 MG/3ML SOPN, , Disp: , Rfl:  .  traZODone (DESYREL) 50 MG tablet, Take 0.5-1 tablets (25-50 mg total) by mouth at bedtime as needed for sleep., Disp: 30 tablet, Rfl: 3 .  vitamin B-12 (CYANOCOBALAMIN) 1000 MCG tablet, Take 1,000 mcg by mouth daily., Disp: , Rfl:   Allergies  Allergen Reactions  . Codeine Itching and Hives  . Latex Hives and Itching     ROS  As noted in HPI.   Physical Exam  BP (!) 140/95 (BP Location: Left Arm)   Pulse 98   Temp 99.1 F (37.3 C) (Oral)   Resp 17   SpO2 100%   Constitutional: Well developed, well nourished, no acute distress Eyes:  EOMI, conjunctiva normal bilaterally HENT: Normocephalic, atraumatic,mucus membranes moist Left ear: External ear: Normal appearance.  Pain with traction on pinna, palpation of tragus.  No pain with palpation of mastoid.  EAC swollen, unable to visualize TM well.  Mild tenderness of the TMJ.  Decreased hearing compared to right Right ear: Normal appearance external ear.  Pinna traction on pinna, palpation of  tragus.  No pain with palpation of mastoid.  EAC slightly swollen, able to visualize TM, appears intact, normal.  Positive whitish debris in EAC.  Hearing intact. No nasal congestion. Neck: No cervical lymphadenopathy Respiratory: Normal inspiratory effort Cardiovascular: Normal rate GI: nondistended skin: No rash, skin intact Musculoskeletal: no deformities Neurologic: Alert & oriented x 3, no focal neuro deficits Psychiatric: Speech and behavior appropriate   ED Course   Medications - No data to display  No orders of the defined types were placed in this encounter.   No results found for this or any previous visit (from the past 24 hour(s)). No results found.  ED Clinical Impression  1. Acute otitis externa of both ears, unspecified type      ED Assessment/Plan  Presentation consistent with a bilateral otitis externa.  There are no ear wicks available at this clinic today.  Patient did not mention sore throat to me.  Will send home with Tylenol, Ciprodex.  Follow-up with Dr. Andee Poles, ENT on-call, if not getting better in several days.  Discussed  MDM, treatment plan, and plan for follow-up with patient. patient agrees with plan.   Meds ordered this encounter  Medications  . ciprofloxacin-dexamethasone (CIPRODEX) OTIC suspension    Sig: Place 4 drops into both ears 2 (two) times daily. X 7 days    Dispense:  7.5 mL    Refill:  0      *This clinic note was created using Scientist, clinical (histocompatibility and immunogenetics). Therefore, there may be occasional mistakes despite careful proofreading.  ?    Domenick Gong, MD 05/25/21 1325

## 2021-05-23 NOTE — Discharge Instructions (Addendum)
Use medication as written.  May take 1000 mg of Tylenol 3-4 times a day as needed for pain.  Do not wear ear buds or insert any thing else, including Q-tips, into your ear until this has resolved.

## 2021-06-13 ENCOUNTER — Other Ambulatory Visit: Payer: Self-pay | Admitting: Internal Medicine

## 2021-08-23 ENCOUNTER — Other Ambulatory Visit: Payer: Self-pay

## 2021-08-23 ENCOUNTER — Encounter: Payer: Self-pay | Admitting: Family

## 2021-08-23 ENCOUNTER — Ambulatory Visit: Payer: BC Managed Care – PPO | Admitting: Family

## 2021-08-23 VITALS — BP 132/90 | HR 96 | Temp 98.9°F | Ht 65.0 in | Wt 202.0 lb

## 2021-08-23 DIAGNOSIS — I10 Essential (primary) hypertension: Secondary | ICD-10-CM | POA: Diagnosis not present

## 2021-08-23 DIAGNOSIS — E559 Vitamin D deficiency, unspecified: Secondary | ICD-10-CM | POA: Diagnosis not present

## 2021-08-23 DIAGNOSIS — Z1231 Encounter for screening mammogram for malignant neoplasm of breast: Secondary | ICD-10-CM | POA: Diagnosis not present

## 2021-08-23 DIAGNOSIS — Z23 Encounter for immunization: Secondary | ICD-10-CM | POA: Diagnosis not present

## 2021-08-23 DIAGNOSIS — F419 Anxiety disorder, unspecified: Secondary | ICD-10-CM | POA: Insufficient documentation

## 2021-08-23 LAB — LIPID PANEL
Cholesterol: 202 mg/dL — ABNORMAL HIGH (ref 0–200)
HDL: 76.7 mg/dL (ref >39.00)
LDL Cholesterol: 108 mg/dL — ABNORMAL HIGH (ref 0–99)
NonHDL: 125.24
Total CHOL/HDL Ratio: 3
Triglycerides: 85 mg/dL (ref 0.0–149.0)
VLDL: 17 mg/dL (ref 0.0–40.0)

## 2021-08-23 LAB — CBC WITH DIFFERENTIAL/PLATELET
Basophils Absolute: 0 10*3/uL (ref 0.0–0.1)
Basophils Relative: 0.6 % (ref 0.0–3.0)
Eosinophils Absolute: 0.1 10*3/uL (ref 0.0–0.7)
Eosinophils Relative: 1.8 % (ref 0.0–5.0)
HCT: 38.1 % (ref 36.0–46.0)
Hemoglobin: 12.3 g/dL (ref 12.0–15.0)
Lymphocytes Relative: 40.4 % (ref 12.0–46.0)
Lymphs Abs: 1.8 10*3/uL (ref 0.7–4.0)
MCHC: 32.3 g/dL (ref 30.0–36.0)
MCV: 86 fl (ref 78.0–100.0)
Monocytes Absolute: 0.2 10*3/uL (ref 0.1–1.0)
Monocytes Relative: 5.5 % (ref 3.0–12.0)
Neutro Abs: 2.3 10*3/uL (ref 1.4–7.7)
Neutrophils Relative %: 51.7 % (ref 43.0–77.0)
Platelets: 173 10*3/uL (ref 150.0–400.0)
RBC: 4.43 Mil/uL (ref 3.87–5.11)
RDW: 14.4 % (ref 11.5–15.5)
WBC: 4.4 10*3/uL (ref 4.0–10.5)

## 2021-08-23 LAB — VITAMIN D 25 HYDROXY (VIT D DEFICIENCY, FRACTURES): VITD: 11.52 ng/mL — ABNORMAL LOW (ref 30.00–100.00)

## 2021-08-23 LAB — COMPREHENSIVE METABOLIC PANEL
ALT: 13 U/L (ref 0–35)
AST: 18 U/L (ref 0–37)
Albumin: 4 g/dL (ref 3.5–5.2)
Alkaline Phosphatase: 85 U/L (ref 39–117)
BUN: 11 mg/dL (ref 6–23)
CO2: 29 mEq/L (ref 19–32)
Calcium: 9.2 mg/dL (ref 8.4–10.5)
Chloride: 103 mEq/L (ref 96–112)
Creatinine, Ser: 0.57 mg/dL (ref 0.40–1.20)
GFR: 102.54 mL/min (ref >60.00)
Glucose, Bld: 101 mg/dL — ABNORMAL HIGH (ref 70–99)
Potassium: 4.2 mEq/L (ref 3.5–5.1)
Sodium: 139 mEq/L (ref 135–145)
Total Bilirubin: 0.5 mg/dL (ref 0.2–1.2)
Total Protein: 7.5 g/dL (ref 6.0–8.3)

## 2021-08-23 LAB — HEMOGLOBIN A1C: Hgb A1c MFr Bld: 5.9 % (ref 4.6–6.5)

## 2021-08-23 LAB — TSH: TSH: 1.11 u[IU]/mL (ref 0.35–5.50)

## 2021-08-23 MED ORDER — SERTRALINE HCL 50 MG PO TABS: 50.0000 mg | ORAL_TABLET | Freq: Every day | ORAL | 3 refills | Status: DC

## 2021-08-23 MED ORDER — LOSARTAN POTASSIUM 25 MG PO TABS: 12.5000 mg | ORAL_TABLET | Freq: Every day | ORAL | 1 refills | Status: DC

## 2021-08-23 NOTE — Assessment & Plan Note (Signed)
Uncontrolled.  Advised to go ahead and restart losartan 12.5 mg in the setting of recent weight gain.  Counseled on monitoring for dizziness as she has been orthostatic in the past.  Labs today and again in 1 week

## 2021-08-23 NOTE — Patient Instructions (Addendum)
Start Zoloft 50 mg for anxiety, depression. May also start losartan 12.5 mg as we discussed.  For blood pressure, the goal is less than 120/80.  You will get labs today, and again 1 week after starting losartan.  Please schedule this at checkout  Mammogram is scheduled for you   Managing Your Hypertension Hypertension, also called high blood pressure, is when the force of the blood pressing against the walls of the arteries is too strong. Arteries are blood vessels that carry blood from your heart throughout your body. Hypertension forces the heart to work harder to pump blood and may cause the arteries to become narrow or stiff. Understanding blood pressure readings Your personal target blood pressure may vary depending on your medical conditions, your age, and other factors. A blood pressure reading includes a higher number over a lower number. Ideally, your blood pressure should be below 120/80. You should know that: The first, or top, number is called the systolic pressure. It is a measure of the pressure in your arteries as your heart beats. The second, or bottom number, is called the diastolic pressure. It is a measure of the pressure in your arteries as the heart relaxes. Blood pressure is classified into four stages. Based on your blood pressure reading, your health care provider may use the following stages to determine what type of treatment you need, if any. Systolic pressure and diastolic pressure are measured in a unit called mmHg. Normal Systolic pressure: below 120. Diastolic pressure: below 80. Elevated Systolic pressure: 120-129. Diastolic pressure: below 80. Hypertension stage 1 Systolic pressure: 130-139. Diastolic pressure: 80-89. Hypertension stage 2 Systolic pressure: 140 or above. Diastolic pressure: 90 or above. How can this condition affect me? Managing your hypertension is an important responsibility. Over time, hypertension can damage the arteries and decrease  blood flow to important parts of the body, including the brain, heart, and kidneys. Having untreated or uncontrolled hypertension can lead to: A heart attack. A stroke. A weakened blood vessel (aneurysm). Heart failure. Kidney damage. Eye damage. Metabolic syndrome. Memory and concentration problems. Vascular dementia. What actions can I take to manage this condition? Hypertension can be managed by making lifestyle changes and possibly by taking medicines. Your health care provider will help you make a plan to bring your blood pressure within a normal range. Nutrition  Eat a diet that is high in fiber and potassium, and low in salt (sodium), added sugar, and fat. An example eating plan is called the Dietary Approaches to Stop Hypertension (DASH) diet. To eat this way: Eat plenty of fresh fruits and vegetables. Try to fill one-half of your plate at each meal with fruits and vegetables. Eat whole grains, such as whole-wheat pasta, brown rice, or whole-grain bread. Fill about one-fourth of your plate with whole grains. Eat low-fat dairy products. Avoid fatty cuts of meat, processed or cured meats, and poultry with skin. Fill about one-fourth of your plate with lean proteins such as fish, chicken without skin, beans, eggs, and tofu. Avoid pre-made and processed foods. These tend to be higher in sodium, added sugar, and fat. Reduce your daily sodium intake. Most people with hypertension should eat less than 1,500 mg of sodium a day. Lifestyle  Work with your health care provider to maintain a healthy body weight or to lose weight. Ask what an ideal weight is for you. Get at least 30 minutes of exercise that causes your heart to beat faster (aerobic exercise) most days of the week. Activities may include  walking, swimming, or biking. Include exercise to strengthen your muscles (resistance exercise), such as weight lifting, as part of your weekly exercise routine. Try to do these types of exercises  for 30 minutes at least 3 days a week. Do not use any products that contain nicotine or tobacco, such as cigarettes, e-cigarettes, and chewing tobacco. If you need help quitting, ask your health care provider. Control any long-term (chronic) conditions you have, such as high cholesterol or diabetes. Identify your sources of stress and find ways to manage stress. This may include meditation, deep breathing, or making time for fun activities. Alcohol use Do not drink alcohol if: Your health care provider tells you not to drink. You are pregnant, may be pregnant, or are planning to become pregnant. If you drink alcohol: Limit how much you use to: 0-1 drink a day for women. 0-2 drinks a day for men. Be aware of how much alcohol is in your drink. In the [place] one drink equals one 12 oz bottle of beer (355 mL), one 5 oz glass of wine (148 mL), or one 1 oz glass of hard liquor (44 mL). Medicines Your health care provider may prescribe medicine if lifestyle changes are not enough to get your blood pressure under control and if: Your systolic blood pressure is 130 or higher. Your diastolic blood pressure is 80 or higher. Take medicines only as told by your health care provider. Follow the directions carefully. Blood pressure medicines must be taken as told by your health care provider. The medicine does not work as well when you skip doses. Skipping doses also puts you at risk for problems. Monitoring Before you monitor your blood pressure: Do not smoke, drink caffeinated beverages, or exercise within 30 minutes before taking a measurement. Use the bathroom and empty your bladder (urinate). Sit quietly for at least 5 minutes before taking measurements. Monitor your blood pressure at home as told by your health care provider. To do this: Sit with your back straight and supported. Place your feet flat on the floor. Do not cross your legs. Support your arm on a flat surface, such as a table. Make sure  your upper arm is at heart level. Each time you measure, take two or three readings one minute apart and record the results. You may also need to have your blood pressure checked regularly by your health care provider. General information Talk with your health care provider about your diet, exercise habits, and other lifestyle factors that may be contributing to hypertension. Review all the medicines you take with your health care provider because there may be side effects or interactions. Keep all visits as told by your health care provider. Your health care provider can help you create and adjust your plan for managing your high blood pressure. Where to find more information National Heart, Lung, and Blood Institute: PopSteam.is American Heart Association: www.heart.org Contact a health care provider if: You think you are having a reaction to medicines you have taken. You have repeated (recurrent) headaches. You feel dizzy. You have swelling in your ankles. You have trouble with your vision. Get help right away if: You develop a severe headache or confusion. You have unusual weakness or numbness, or you feel faint. You have severe pain in your chest or abdomen. You vomit repeatedly. You have trouble breathing. These symptoms may represent a serious problem that is an emergency. Do not wait to see if the symptoms will go away. Get medical help right away. Call your  local emergency services (911 in the [place] Do not drive yourself to the hospital. Summary Hypertension is when the force of blood pumping through your arteries is too strong. If this condition is not controlled, it may put you at risk for serious complications. Your personal target blood pressure may vary depending on your medical conditions, your age, and other factors. For most people, a normal blood pressure is less than 120/80. Hypertension is managed by lifestyle changes, medicines, or both. Lifestyle changes to  help manage hypertension include losing weight, eating a healthy, low-sodium diet, exercising more, stopping smoking, and limiting alcohol. This information is not intended to replace advice given to you by your health care provider. Make sure you discuss any questions you have with your health care provider. Document Revised: 01/13/2020 Document Reviewed: 11/08/2019 Elsevier Patient Education  2022 Elsevier Inc.  Generalized Anxiety Disorder, Adult Generalized anxiety disorder (GAD) is a mental health condition. Unlike normal worries, anxiety related to GAD is not triggered by a specific event. These worries do not fade or get better with time. GAD interferes with relationships, work, and school. GAD symptoms can vary from mild to severe. People with severe GAD can have intense waves of anxiety with physical symptoms that are similar to panic attacks. What are the causes? The exact cause of GAD is not known, but the following are believed to have an impact: Differences in natural brain chemicals. Genes passed down from parents to children. Differences in the way threats are perceived. Development during childhood. Personality. What increases the risk? The following factors may make you more likely to develop this condition: Being female. Having a family history of anxiety disorders. Being very shy. Experiencing very stressful life events, such as the death of a loved one. Having a very stressful family environment. What are the signs or symptoms? People with GAD often worry excessively about many things in their lives, such as their health and family. Symptoms may also include: Mental and emotional symptoms: Worrying excessively about natural disasters. Fear of being late. Difficulty concentrating. Fears that others are judging your performance. Physical symptoms: Fatigue. Headaches, muscle tension, muscle twitches, trembling, or feeling shaky. Feeling like your heart is pounding or  beating very fast. Feeling out of breath or like you cannot take a deep breath. Having trouble falling asleep or staying asleep, or experiencing restlessness. Sweating. Nausea, diarrhea, or irritable bowel syndrome (IBS). Behavioral symptoms: Experiencing erratic moods or irritability. Avoidance of new situations. Avoidance of people. Extreme difficulty making decisions. How is this diagnosed? This condition is diagnosed based on your symptoms and medical history. You will also have a physical exam. Your health care provider may perform tests to rule out other possible causes of your symptoms. To be diagnosed with GAD, a person must have anxiety that: Is out of his or her control. Affects several different aspects of his or her life, such as work and relationships. Causes distress that makes him or her unable to take part in normal activities. Includes at least three symptoms of GAD, such as restlessness, fatigue, trouble concentrating, irritability, muscle tension, or sleep problems. Before your health care provider can confirm a diagnosis of GAD, these symptoms must be present more days than they are not, and they must last for 6 months or longer. How is this treated? This condition may be treated with: Medicine. Antidepressant medicine is usually prescribed for long-term daily control. Anti-anxiety medicines may be added in severe cases, especially when panic attacks occur. Talk therapy (psychotherapy). Certain  types of talk therapy can be helpful in treating GAD by providing support, education, and guidance. Options include: Cognitive behavioral therapy (CBT). People learn coping skills and self-calming techniques to ease their physical symptoms. They learn to identify unrealistic thoughts and behaviors and to replace them with more appropriate thoughts and behaviors. Acceptance and commitment therapy (ACT). This treatment teaches people how to be mindful as a way to cope with unwanted  thoughts and feelings. Biofeedback. This process trains you to manage your body's response (physiological response) through breathing techniques and relaxation methods. You will work with a therapist while machines are used to monitor your physical symptoms. Stress management techniques. These include yoga, meditation, and exercise. A mental health specialist can help determine which treatment is best for you. Some people see improvement with one type of therapy. However, other people require a combination of therapies. Follow these instructions at home: Lifestyle Maintain a consistent routine and schedule. Anticipate stressful situations. Create a plan, and allow extra time to work with your plan. Practice stress management or self-calming techniques that you have learned from your therapist or your health care provider. General instructions Take over-the-counter and prescription medicines only as told by your health care provider. Understand that you are likely to have setbacks. Accept this and be kind to yourself as you persist to take better care of yourself. Recognize and accept your accomplishments, even if you judge them as small. Keep all follow-up visits as told by your health care provider. This is important. Contact a health care provider if: Your symptoms do not get better. Your symptoms get worse. You have signs of depression, such as: A persistently sad or irritable mood. Loss of enjoyment in activities that used to bring you joy. Change in weight or eating. Changes in sleeping habits. Avoiding friends or family members. Loss of energy for normal tasks. Feelings of guilt or worthlessness. Get help right away if: You have serious thoughts about hurting yourself or others. If you ever feel like you may hurt yourself or others, or have thoughts about taking your own life, get help right away. Go to your nearest emergency department or: Call your local emergency services (911 in  the [place] Call a suicide crisis helpline, such as the National Suicide Prevention Lifeline at (336) 117-69441-217-192-9200. This is open 24 hours a day in the [place] Text the Crisis Text Line at 832-370-1350741741 (in the [place] Summary Generalized anxiety disorder (GAD) is a mental health condition that involves worry that is not triggered by a specific event. People with GAD often worry excessively about many things in their lives, such as their health and family. GAD may cause symptoms such as restlessness, trouble concentrating, sleep problems, frequent sweating, nausea, diarrhea, headaches, and trembling or muscle twitching. A mental health specialist can help determine which treatment is best for you. Some people see improvement with one type of therapy. However, other people require a combination of therapies. This information is not intended to replace advice given to you by your health care provider. Make sure you discuss any questions you have with your health care provider. Document Revised: 09/28/2019 Document Reviewed: 09/28/2019 Elsevier Patient Education  2022 [employer]

## 2021-08-23 NOTE — Progress Notes (Signed)
Subjective:    Patient ID: Kelly [name], female    DOB: Dec 11, 1966, 55 y.o.   MRN: [id number]  CC: Kelly [name] is a 55 y.o. female who presents today for an acute visit.    HPI: Anxiety has increased in the last couple of years since 2019 after she had covid, worsening.  Worse this past 6 months. She is having anxiety attacks. Anxiety is interfering with sleep with trouble staying asleep.   She works for [employer]  and has been driving the bus for 15 years.   Lives with husband and two adult children living with her. Everything at home is going well. Work is more of a stressor for her.  More anxiety however endorses some depression.  She was started on zoloft when her twins were born , 28 years ago and discontinued as symptoms resolved.  No si/hi. no history hospitalized for mental health.  She never tried trazodone.   Seen 04/2020 for  edema , suspected from venous insufficiency. Advised compression stockings and lasix 20mg  prn by Dr  In the past she had been on losartan and hctz.  She suspects recent weight gain has contributed to elevated blood pressure.  No chest pain, shortness.  She reports a history of low blood pressure when she had lost significant amount of weight and was on blood pressure medications at the same time.  No dizziness currently  HISTORY:  Past Medical History:  Diagnosis Date   Chest pain 2002   abnormal EKG, negative treadmill stress test/Holter myoview, Dr. 2003   Past Surgical History:  Procedure Laterality Date   ABDOMINAL HYSTERECTOMY  35   secondary to post partum hemorrhage causing anemia   GASTRIC BYPASS  2016   SPINE SURGERY     lumbar spine, L4-L5 ruptured disc   Family History  Problem Relation Age of Onset   Hyperlipidemia Mother    Arthritis Mother    Hypertension Mother    Hypertension Father    Heart disease Father    Heart disease Maternal Uncle    Coronary artery disease Maternal Uncle    Diabetes Paternal Aunt     Coronary artery disease Maternal Grandfather    Heart attack Maternal Grandfather    Breast cancer Maternal Aunt 45   Colon cancer Maternal Grandmother    Heart disease Paternal Grandmother    Diabetes Brother    Hyperlipidemia Brother    Hypertension Brother    Heart disease Brother     Allergies: Codeine and Latex Current Outpatient Medications on File Prior to Visit  Medication Sig Dispense Refill   gabapentin (NEURONTIN) 300 MG capsule Take 1 capsule (300 mg total) by mouth 2 (two) times daily. 60 capsule 2   Multiple Vitamin (MULTIVITAMIN) capsule Take 1 capsule by mouth daily.     vitamin B-12 (CYANOCOBALAMIN) 1000 MCG tablet Take 1,000 mcg by mouth daily.     BD PEN NEEDLE NANO 2ND GEN 32G X 4 MM MISC  (Patient not taking: Reported on 08/23/2021)     ciprofloxacin-dexamethasone (CIPRODEX) OTIC suspension Place 4 drops into both ears 2 (two) times daily. X 7 days (Patient not taking: Reported on 08/23/2021) 7.5 mL 0   ergocalciferol (DRISDOL) 1.25 MG (50000 UT) capsule Take 1 capsule (50,000 Units total) by mouth once a week. 12 capsule 3   LINZESS 145 MCG CAPS capsule Take 145 mcg by mouth daily. (Patient not taking: Reported on 08/23/2021)     SAXENDA 18 MG/3ML SOPN  (Patient  not taking: Reported on 08/23/2021)     [DISCONTINUED] furosemide (LASIX) 20 MG tablet Take 1 tablet (20 mg total) by mouth as needed. 30 tablet 0   No current facility-administered medications on file prior to visit.    Social History   Tobacco Use   Smoking status: Never   Smokeless tobacco: Never  Vaping Use   Vaping Use: Former   Quit date: 07/23/2016  Substance Use Topics   Alcohol use: Not Currently    Alcohol/week: 0.0 standard drinks   Drug use: No    Review of Systems  Constitutional:  Negative for chills and fever.  Respiratory:  Negative for cough.   Cardiovascular:  Negative for chest pain and palpitations.  Gastrointestinal:  Negative for nausea and vomiting.  Psychiatric/Behavioral:   Positive for sleep disturbance. Negative for suicidal ideas. The patient is nervous/anxious.      Objective:    BP 132/90 (BP Location: Left Arm, Patient Position: Sitting, Cuff Size: Large)   Pulse 96   Temp 98.9 F (37.2 C) (Oral)   Ht 5\' 5"  (1.651 m)   Wt 202 lb (91.6 kg)   SpO2 96%   BMI 33.61 kg/m  BP Readings from Last 3 Encounters:  08/23/21 132/90  05/23/21 (!) 140/95  08/07/20 (!) 129/94   Wt Readings from Last 3 Encounters:  08/23/21 202 lb (91.6 kg)  05/10/20 176 lb 9.6 oz (80.1 kg)  11/10/19 182 lb 6.4 oz (82.7 kg)      Physical Exam Vitals reviewed.  Constitutional:      Appearance: She is well-developed.  Eyes:     Conjunctiva/sclera: Conjunctivae normal.  Cardiovascular:     Rate and Rhythm: Normal rate and regular rhythm.     Pulses: Normal pulses.     Heart sounds: Normal heart sounds.  Pulmonary:     Effort: Pulmonary effort is normal.     Breath sounds: Normal breath sounds. No wheezing, rhonchi or rales.  Skin:    General: Skin is warm and dry.  Neurological:     Mental Status: She is alert.  Psychiatric:        Speech: Speech normal.        Behavior: Behavior normal.        Thought Content: Thought content normal.       Assessment & Plan:   Problem List Items Addressed This Visit       Cardiovascular and Mediastinum   Essential hypertension    Uncontrolled.  Advised to go ahead and restart losartan 12.5 mg in the setting of recent weight gain.  Counseled on monitoring for dizziness as she has been orthostatic in the past.  Labs today and again in 1 week      Relevant Medications   losartan (COZAAR) 25 MG tablet   Other Relevant Orders   Basic metabolic panel     Other   Anxiety    Uncontrolled.  Start Zoloft 50 mg.  Patient politely declines counseling referral at this time however she will consider in the future.  We also discussed taking leave absence from work which I think is appropriate so that she can focus on her health.   She will bring this paperwork to me.  Close follow-up      Relevant Medications   sertraline (ZOLOFT) 50 MG tablet   Other Relevant Orders   TSH   CBC with Differential/Platelet   Comprehensive metabolic panel   Hemoglobin A1c   Lipid panel   Vitamin D deficiency  Relevant Orders   VITAMIN D 25 Hydroxy (Vit-D Deficiency, Fractures)   Other Visit Diagnoses     Need for immunization against influenza    -  Primary   Relevant Orders   Flu Vaccine QUAD 27mo+IM (Fluarix, Fluzone & Alfiuria Quad PF) (Completed)   Encounter for screening mammogram for malignant neoplasm of breast       Relevant Orders   MM 3D SCREEN BREAST BILATERAL         I have discontinued Anastasya C. Chermak "Jackie"'s traZODone. I am also having her start on sertraline and losartan. Additionally, I am having her maintain her multivitamin, gabapentin, Saxenda, vitamin B-12, BD Pen Needle Nano 2nd Gen, Linzess, ergocalciferol, and ciprofloxacin-dexamethasone.   Meds ordered this encounter  Medications   sertraline (ZOLOFT) 50 MG tablet    Sig: Take 1 tablet (50 mg total) by mouth at bedtime.    Dispense:  90 tablet    Refill:  3    Order Specific Question:   Supervising Provider    Answer:   Duncan Dull L [[id number]]   losartan (COZAAR) 25 MG tablet    Sig: Take 0.5 tablets (12.5 mg total) by mouth daily.    Dispense:  90 tablet    Refill:  1    Order Specific Question:   Supervising Provider    Answer:   [name] [[id number]]    Return precautions given.   Risks, benefits, and alternatives of the medications and treatment plan prescribed today were discussed, and patient expressed understanding.   Education regarding symptom management and diagnosis given to patient on AVS.  Continue to follow with [name], MD for routine health maintenance.   [place] [employer] [place] and I agreed with plan.   Rennie Plowman, FNP

## 2021-08-23 NOTE — Assessment & Plan Note (Signed)
Uncontrolled.  Start Zoloft 50 mg.  Patient politely declines counseling referral at this time however she will consider in the future.  We also discussed taking leave absence from work which I think is appropriate so that she can focus on her health.  She will bring this paperwork to me.  Close follow-up

## 2021-08-27 NOTE — Progress Notes (Signed)
I spoke with patient & stated that she preferred afternoons. I have scheduled patient 9/21 at 4p. LM for patient with time, date & location.

## 2021-08-30 ENCOUNTER — Other Ambulatory Visit: Payer: BC Managed Care – PPO

## 2021-09-11 ENCOUNTER — Other Ambulatory Visit: Payer: Self-pay

## 2021-09-11 ENCOUNTER — Ambulatory Visit
Admission: RE | Admit: 2021-09-11 | Discharge: 2021-09-11 | Disposition: A | Discharge status: Home / Self Care | Payer: BC Managed Care – PPO | Source: Ambulatory Visit | Attending: Family | Admitting: Family

## 2021-09-11 DIAGNOSIS — Z1231 Encounter for screening mammogram for malignant neoplasm of breast: Secondary | ICD-10-CM | POA: Diagnosis not present

## 2021-09-25 ENCOUNTER — Ambulatory Visit: Payer: BC Managed Care – PPO | Admitting: Internal Medicine

## 2021-10-07 ENCOUNTER — Encounter: Payer: Self-pay | Admitting: Internal Medicine

## 2021-10-07 ENCOUNTER — Ambulatory Visit (INDEPENDENT_AMBULATORY_CARE_PROVIDER_SITE_OTHER): Payer: BC Managed Care – PPO | Admitting: Internal Medicine

## 2021-10-07 ENCOUNTER — Other Ambulatory Visit: Payer: Self-pay

## 2021-10-07 VITALS — BP 136/88 | HR 78 | Temp 96.5°F | Ht 65.0 in | Wt 202.8 lb

## 2021-10-07 DIAGNOSIS — E559 Vitamin D deficiency, unspecified: Secondary | ICD-10-CM

## 2021-10-07 DIAGNOSIS — E538 Deficiency of other specified B group vitamins: Secondary | ICD-10-CM

## 2021-10-07 DIAGNOSIS — K912 Postsurgical malabsorption, not elsewhere classified: Secondary | ICD-10-CM

## 2021-10-07 DIAGNOSIS — Z0001 Encounter for general adult medical examination with abnormal findings: Secondary | ICD-10-CM | POA: Diagnosis not present

## 2021-10-07 DIAGNOSIS — E782 Mixed hyperlipidemia: Secondary | ICD-10-CM

## 2021-10-07 DIAGNOSIS — I10 Essential (primary) hypertension: Secondary | ICD-10-CM

## 2021-10-07 DIAGNOSIS — R7303 Prediabetes: Secondary | ICD-10-CM

## 2021-10-07 DIAGNOSIS — R7301 Impaired fasting glucose: Secondary | ICD-10-CM

## 2021-10-07 DIAGNOSIS — R251 Tremor, unspecified: Secondary | ICD-10-CM

## 2021-10-07 DIAGNOSIS — E669 Obesity, unspecified: Secondary | ICD-10-CM

## 2021-10-07 DIAGNOSIS — K909 Intestinal malabsorption, unspecified: Secondary | ICD-10-CM

## 2021-10-07 LAB — LIPID PANEL
Cholesterol: 200 mg/dL (ref 0–200)
HDL: 83.4 mg/dL (ref >39.00)
LDL Cholesterol: 100 mg/dL — ABNORMAL HIGH (ref 0–99)
NonHDL: 117.03
Total CHOL/HDL Ratio: 2
Triglycerides: 84 mg/dL (ref 0.0–149.0)
VLDL: 16.8 mg/dL (ref 0.0–40.0)

## 2021-10-07 LAB — COMPREHENSIVE METABOLIC PANEL
ALT: 16 U/L (ref 0–35)
AST: 19 U/L (ref 0–37)
Albumin: 4.2 g/dL (ref 3.5–5.2)
Alkaline Phosphatase: 85 U/L (ref 39–117)
BUN: 10 mg/dL (ref 6–23)
CO2: 29 mEq/L (ref 19–32)
Calcium: 9 mg/dL (ref 8.4–10.5)
Chloride: 105 mEq/L (ref 96–112)
Creatinine, Ser: 0.62 mg/dL (ref 0.40–1.20)
GFR: 100.4 mL/min (ref >60.00)
Glucose, Bld: 94 mg/dL (ref 70–99)
Potassium: 4 mEq/L (ref 3.5–5.1)
Sodium: 140 mEq/L (ref 135–145)
Total Bilirubin: 0.4 mg/dL (ref 0.2–1.2)
Total Protein: 7.4 g/dL (ref 6.0–8.3)

## 2021-10-07 LAB — TSH: TSH: 1.97 u[IU]/mL (ref 0.35–5.50)

## 2021-10-07 MED ORDER — SAXENDA 18 MG/3ML ~~LOC~~ SOPN: PEN_INJECTOR | SUBCUTANEOUS | 0 refills | Status: DC

## 2021-10-07 MED ORDER — ERGOCALCIFEROL 1.25 MG (50000 UT) PO CAPS: 50000.0000 [IU] | ORAL_CAPSULE | ORAL | 0 refills | Status: DC

## 2021-10-07 MED ORDER — LINZESS 145 MCG PO CAPS: 145.0000 ug | ORAL_CAPSULE | Freq: Every day | ORAL | 5 refills | Status: DC

## 2021-10-07 NOTE — Assessment & Plan Note (Signed)

## 2021-10-07 NOTE — Patient Instructions (Addendum)
Your vitamin  D was very low last month   .I recommend  treating with Drisdol   a mega dose weekly  for the next  3 months  And have sent the rx to your pharmacy    You need to shrink your stomach again..  Your a1c was "prediabetic"  so I recommend low carb protein shakes and  [employer] "low carb power bowl"  entrees and  "Steamer" entrees are are great low carb entrees that microwave in 5 minutes       Return for labs and follow up  in 3 months

## 2021-10-07 NOTE — Assessment & Plan Note (Signed)
a1c has risen again since stopping  Saxenda .  She has requested to resume medication .   Lab Results  Component Value Date   HGBA1C 5.9 08/23/2021

## 2021-10-07 NOTE — Assessment & Plan Note (Signed)
Improved with losartan 12.5 mg  Daily home readings have been 130/80 or less.     Lab Results  Component Value Date   CREATININE 0.62 10/07/2021   Lab Results  Component Value Date   NA 140 10/07/2021   K 4.0 10/07/2021   CL 105 10/07/2021   CO2 29 10/07/2021

## 2021-10-07 NOTE — Progress Notes (Addendum)
Patient ID: Kelly [name], female    DOB: 1966/09/30  Age: 55 y.o. MRN: [id number]  The patient is here for annual PREVENTIVE  examination and management of other chronic and acute problems.   This visit occurred during the SARS-CoV-2 public health emergency.  Safety protocols were in place, including screening questions prior to the visit, additional usage of staff PPE, and extensive cleaning of exam room while observing appropriate contact time as indicated for disinfecting solutions.   The risk factors are reflected in the social history.  The roster of all physicians providing medical care to patient - is listed in the Snapshot section of the chart.  Activities of daily living:  The patient is 100% independent in all ADLs: dressing, toileting, feeding as well as independent mobility  Home safety : The patient has smoke detectors in the home. They wear seatbelts.  There are no firearms at home. There is no violence in the home.   There is no risks for hepatitis, STDs or HIV. There is no   history of blood transfusion. They have no travel history to infectious disease endemic areas of the world.  The patient has seen their dentist in the last six month. They have seen their eye doctor in the last year. They admit to slight hearing difficulty with regard to whispered voices and some television programs.  They have deferred audiologic testing in the last year.  They do not  have excessive sun exposure. Discussed the need for sun protection: hats, long sleeves and use of sunscreen if there is significant sun exposure.   Diet: the importance of a healthy diet is discussed. They do have a healthy diet.  The benefits of regular aerobic exercise were discussed. She walks 4 times per week ,  20 minutes.   Depression screen: there are no signs or vegative symptoms of depression- irritability, change in appetite, anhedonia, sadness/tearfullness.  The following portions of the patient's history  were reviewed and updated as appropriate: allergies, current medications, past family history, past medical history,  past surgical history, past social history  and problem list.  Visual acuity was not assessed per patient preference since she has regular follow up with her ophthalmologist. Hearing and body mass index were assessed and reviewed.   During the course of the visit the patient was educated and counseled about appropriate screening and preventive services including : fall prevention , diabetes screening, nutrition counseling, colorectal cancer screening, and recommended immunizations.    CC: The primary encounter diagnosis was Tremor of both hands. Diagnoses of Vitamin B12 deficiency due to intestinal malabsorption, Vitamin D deficiency, Postoperative malabsorption, Moderate mixed hyperlipidemia not requiring statin therapy, Prediabetes, Encounter for general adult medical examination with abnormal findings, Essential hypertension, Impaired fasting glucose, and Obesity (BMI 30.0-34.9) were also pertinent to this visit.  1) LEFT shoulder pain ,  finished with orthopedics in March.  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  Still part time driving 5 days per week.  M  3) depression:  medication helping  4) "weird headaches"  L occurring in he center of face and on the right  lateral forehead.  Cluster headache pattern.  Will often wak up with them.  Uses 1500 mg tylenol per dose and maxes out 4000 mg daily for a minimum   of 3 days.  Has been a recurring patter for the past 4  months .  Right ear tears up, has a bilateral hand tremor that worsens  with headache and improves when headache resolves.     5) Obesity / Diet  has been off.  Bariatric surgery 2016,  gaining weight   25 lbs since last year.  Wants to get back to 150 to 155 Walking and chair yoga.    History Kelly [name] has a past  medical history of Chest pain (2002).   She has a past surgical history that includes Abdominal hysterectomy (35); Gastric bypass (2016); and Spine surgery.   Her family history includes Arthritis in her mother; Breast cancer (age of onset: 1) in her maternal aunt; Colon cancer in her maternal grandmother; Coronary artery disease in her maternal grandfather and maternal uncle; Diabetes in her brother and paternal aunt; Heart attack in her maternal grandfather; Heart disease in her brother, father, maternal uncle, and paternal grandmother; Hyperlipidemia in her brother and mother; Hypertension in her brother, father, and mother.She reports that she has never smoked. She has never used smokeless tobacco. She reports that she does not currently use alcohol. She reports that she does not use drugs.  Outpatient Medications Prior to Visit  Medication Sig Dispense Refill   losartan (COZAAR) 25 MG tablet Take 0.5 tablets (12.5 mg total) by mouth daily. 90 tablet 1   Multiple Vitamin (MULTIVITAMIN) capsule Take 1 capsule by mouth daily.     sertraline (ZOLOFT) 50 MG tablet Take 1 tablet (50 mg total) by mouth at bedtime. 90 tablet 3   LINZESS 145 MCG CAPS capsule Take 145 mcg by mouth daily.     BD PEN NEEDLE NANO 2ND GEN 32G X 4 MM MISC  (Patient not taking: No sig reported)     gabapentin (NEURONTIN) 300 MG capsule Take 1 capsule (300 mg total) by mouth 2 (two) times daily. (Patient not taking: Reported on 10/07/2021) 60 capsule 2   vitamin B-12 (CYANOCOBALAMIN) 1000 MCG tablet Take 1,000 mcg by mouth daily. (Patient not taking: Reported on 10/07/2021)     ciprofloxacin-dexamethasone (CIPRODEX) OTIC suspension Place 4 drops into both ears 2 (two) times daily. X 7 days (Patient not taking: No sig reported) 7.5 mL 0   ergocalciferol (DRISDOL) 1.25 MG (50000 UT) capsule Take 1 capsule (50,000 Units total) by mouth once a week. (Patient not taking: Reported on 10/07/2021) 12 capsule 3   SAXENDA 18 MG/3ML SOPN   (Patient not taking: No sig reported)     No facility-administered medications prior to visit.    Review of Systems  Patient denies  fevers, malaise, unintentional weight loss, skin rash, eye pain, sinus congestion and sinus pain, sore throat, dysphagia,  hemoptysis , cough, dyspnea, wheezing, chest pain, palpitations, orthopnea, edema, abdominal pain, nausea, melena, diarrhea, constipation, flank pain, dysuria, hematuria, urinary  Frequency, nocturia, numbness, tingling, seizures,  Focal weakness, Loss of consciousness,  Tremor, insomnia, depression, anxiety, and suicidal ideation.     Objective:  BP 136/88 (BP Location: Left Arm, Patient Position: Sitting, Cuff Size: Large)   Pulse 78   Temp (!) 96.5 F (35.8 C) (Temporal)   Ht 5\' 5"  (1.651 m)   Wt 202 lb 12.8 oz (92 kg)   SpO2 98%   BMI 33.75 kg/m   Physical Exam    General appearance: alert, cooperative and appears stated age Head: Normocephalic, without obvious abnormality, atraumatic Eyes: conjunctivae/corneas clear. PERRL, EOM's intact. Fundi benign. Ears: normal TM's and external ear canals both ears Nose: Nares normal. Septum midline. Mucosa normal. No  drainage or sinus tenderness. Throat: lips, mucosa, and tongue normal; teeth and gums normal Neck: no adenopathy, no carotid bruit, no JVD, supple, symmetrical, trachea midline and thyroid not enlarged, symmetric, no tenderness/mass/nodules Lungs: clear to auscultation bilaterally Breasts: normal appearance, no masses or tenderness Heart: regular rate and rhythm, S1, S2 normal, no murmur, click, rub or gallop Abdomen: soft, non-tender; bowel sounds normal; no masses,  no organomegaly Extremities: extremities normal, atraumatic, no cyanosis or edema Pulses: 2+ and symmetric Skin: Skin color, texture, turgor normal. No rashes or lesions Neurologic: Alert and oriented X 3, normal strength and tone. Normal symmetric reflexes. Normal coordination and gait.    Assessment &  Plan:   Problem List Items Addressed This Visit       Unprioritized   Vitamin B12 deficiency due to intestinal malabsorption   Vitamin D deficiency    Recurrent ,  Secondary to bariatric surgery.  Resume mega dose weekly       Relevant Orders   VITAMIN D 25 Hydroxy (Vit-D Deficiency, Fractures)   Obesity (BMI 30.0-34.9)    She has had significant weight gain since reaching a nadir after bariatric surgery several  Years ago.  Resuming saxenda.  I have addressed  BMI and recommended she resume the diet that was advocated  pre bariatric surgery       Essential hypertension    Improved with losartan 12.5 mg  Daily home readings have been 130/80 or less.     Lab Results  Component Value Date   CREATININE 0.62 10/07/2021   Lab Results  Component Value Date   NA 140 10/07/2021   K 4.0 10/07/2021   CL 105 10/07/2021   CO2 29 10/07/2021         Postoperative malabsorption   Relevant Orders   Comprehensive metabolic panel (Completed)   Impaired fasting glucose    a1c has risen again since stopping  Saxenda .  She has requested to resume medication .   Lab Results  Component Value Date   HGBA1C 5.9 08/23/2021         Encounter for general adult medical examination with abnormal findings    age appropriate education and counseling updated, referrals for preventative services and immunizations addressed, dietary and smoking counseling addressed, most recent labs reviewed.  I have personally reviewed and have noted:   1) the patient's medical and social history 2) The pt's use of alcohol, tobacco, and illicit drugs 3) The patient's current medications and supplements 4) Functional ability including ADL's, fall risk, home safety risk, hearing and visual impairment 5) Diet and physical activities 6) Evidence for depression or mood disorder 7) The patient's height, weight, and BMI have been recorded in the chart   I have made referrals, and provided counseling and education  based on review of the above      Other Visit Diagnoses     Tremor of both hands    -  Primary   Relevant Orders   TSH (Completed)   Moderate mixed hyperlipidemia not requiring statin therapy       Relevant Orders   Lipid panel (Completed)   Prediabetes       Relevant Orders   Hemoglobin A1c   Comprehensive metabolic panel       I have discontinued Na C. Beil "Jackie"'s ergocalciferol and ciprofloxacin-dexamethasone. I have also changed her Linzess and Saxenda. Additionally, I am having her start on ergocalciferol. Lastly, I am having her maintain her multivitamin, gabapentin, vitamin B-12, BD Pen  Needle Nano 2nd Gen, sertraline, and losartan.  Meds ordered this encounter  Medications   LINZESS 145 MCG CAPS capsule    Sig: Take 1 capsule (145 mcg total) by mouth daily.    Dispense:  30 capsule    Refill:  5   ergocalciferol (DRISDOL) 1.25 MG (50000 UT) capsule    Sig: Take 1 capsule (50,000 Units total) by mouth once a week.    Dispense:  12 capsule    Refill:  0   SAXENDA 18 MG/3ML SOPN    Sig: Inject 1.2 mg into the skin daily for 7 days, THEN 1.2 mg daily for 7 days, THEN 2.4 mg daily for 7 days, THEN 3 mg daily for 7 days.    Dispense:  9.1 mL    Refill:  0    Medications Discontinued During This Encounter  Medication Reason   ciprofloxacin-dexamethasone (CIPRODEX) OTIC suspension    ergocalciferol (DRISDOL) 1.25 MG (50000 UT) capsule Completed Course   SAXENDA 18 MG/3ML SOPN    LINZESS 145 MCG CAPS capsule Reorder    Follow-up: Return in about 3 months (around 01/07/2022).   [name], MD

## 2021-10-07 NOTE — Assessment & Plan Note (Signed)
She has had significant weight gain since reaching a nadir after bariatric surgery several  Years ago.  Resuming saxenda.  I have addressed  BMI and recommended she resume the diet that was advocated  pre bariatric surgery

## 2021-10-07 NOTE — Assessment & Plan Note (Signed)
Recurrent ,  Secondary to bariatric surgery.  Resume mega dose weekly

## 2021-10-17 ENCOUNTER — Telehealth: Payer: Self-pay

## 2021-10-17 NOTE — Telephone Encounter (Signed)
PA for Bernie Covey has been approved from 10/09/2021 to 02/09/2022.

## 2021-12-24 ENCOUNTER — Other Ambulatory Visit: Payer: Self-pay | Admitting: Internal Medicine

## 2022-01-10 ENCOUNTER — Other Ambulatory Visit: Payer: Self-pay

## 2022-01-10 ENCOUNTER — Other Ambulatory Visit (INDEPENDENT_AMBULATORY_CARE_PROVIDER_SITE_OTHER): Payer: BC Managed Care – PPO

## 2022-01-10 DIAGNOSIS — I10 Essential (primary) hypertension: Secondary | ICD-10-CM | POA: Diagnosis not present

## 2022-01-10 DIAGNOSIS — E559 Vitamin D deficiency, unspecified: Secondary | ICD-10-CM

## 2022-01-10 DIAGNOSIS — R7303 Prediabetes: Secondary | ICD-10-CM | POA: Diagnosis not present

## 2022-01-10 LAB — BASIC METABOLIC PANEL
BUN: 16 mg/dL (ref 6–23)
CO2: 31 mEq/L (ref 19–32)
Calcium: 9.2 mg/dL (ref 8.4–10.5)
Chloride: 105 mEq/L (ref 96–112)
Creatinine, Ser: 0.61 mg/dL (ref 0.40–1.20)
GFR: 100.61 mL/min (ref >60.00)
Glucose, Bld: 102 mg/dL — ABNORMAL HIGH (ref 70–99)
Potassium: 3.9 mEq/L (ref 3.5–5.1)
Sodium: 141 mEq/L (ref 135–145)

## 2022-01-10 LAB — HEMOGLOBIN A1C: Hgb A1c MFr Bld: 5.8 % (ref 4.6–6.5)

## 2022-01-10 LAB — COMPREHENSIVE METABOLIC PANEL
ALT: 14 U/L (ref 0–35)
AST: 15 U/L (ref 0–37)
Albumin: 4.2 g/dL (ref 3.5–5.2)
Alkaline Phosphatase: 71 U/L (ref 39–117)
BUN: 16 mg/dL (ref 6–23)
CO2: 31 mEq/L (ref 19–32)
Calcium: 9.2 mg/dL (ref 8.4–10.5)
Chloride: 105 mEq/L (ref 96–112)
Creatinine, Ser: 0.61 mg/dL (ref 0.40–1.20)
GFR: 100.61 mL/min (ref >60.00)
Glucose, Bld: 102 mg/dL — ABNORMAL HIGH (ref 70–99)
Potassium: 3.9 mEq/L (ref 3.5–5.1)
Sodium: 141 mEq/L (ref 135–145)
Total Bilirubin: 0.5 mg/dL (ref 0.2–1.2)
Total Protein: 7.3 g/dL (ref 6.0–8.3)

## 2022-01-10 LAB — VITAMIN D 25 HYDROXY (VIT D DEFICIENCY, FRACTURES): VITD: 16.5 ng/mL — ABNORMAL LOW (ref 30.00–100.00)

## 2022-01-13 ENCOUNTER — Ambulatory Visit: Payer: BC Managed Care – PPO | Admitting: Internal Medicine

## 2022-02-04 ENCOUNTER — Ambulatory Visit: Payer: BC Managed Care – PPO | Admitting: Internal Medicine

## 2022-02-11 ENCOUNTER — Telehealth: Payer: Self-pay

## 2022-02-11 NOTE — Telephone Encounter (Signed)
PA for Saxenda has been submitted on covermymeds.  

## 2022-02-12 NOTE — Telephone Encounter (Signed)
PA for Saxenda was denied because pt did not meet requirements of her plan. Medication was denied because pt has not maintained her 4% weight loss.

## 2022-02-13 NOTE — Telephone Encounter (Signed)
Pt advised - will speak w/ Dr Darrick Huntsman at appt on 3/7

## 2022-02-25 ENCOUNTER — Encounter: Payer: Self-pay | Admitting: Internal Medicine

## 2022-02-25 ENCOUNTER — Ambulatory Visit: Payer: BC Managed Care – PPO | Admitting: Internal Medicine

## 2022-02-25 ENCOUNTER — Other Ambulatory Visit: Payer: Self-pay

## 2022-02-25 DIAGNOSIS — E538 Deficiency of other specified B group vitamins: Secondary | ICD-10-CM | POA: Diagnosis not present

## 2022-02-25 DIAGNOSIS — N951 Menopausal and female climacteric states: Secondary | ICD-10-CM

## 2022-02-25 DIAGNOSIS — M5432 Sciatica, left side: Secondary | ICD-10-CM | POA: Diagnosis not present

## 2022-02-25 DIAGNOSIS — K909 Intestinal malabsorption, unspecified: Secondary | ICD-10-CM

## 2022-02-25 DIAGNOSIS — E559 Vitamin D deficiency, unspecified: Secondary | ICD-10-CM

## 2022-02-25 DIAGNOSIS — R7301 Impaired fasting glucose: Secondary | ICD-10-CM

## 2022-02-25 DIAGNOSIS — K581 Irritable bowel syndrome with constipation: Secondary | ICD-10-CM

## 2022-02-25 MED ORDER — ERGOCALCIFEROL 1.25 MG (50000 UT) PO CAPS: 50000.0000 [IU] | ORAL_CAPSULE | ORAL | 0 refills | Status: DC

## 2022-02-25 MED ORDER — GABAPENTIN 300 MG PO CAPS: 300.0000 mg | ORAL_CAPSULE | Freq: Two times a day (BID) | ORAL | 1 refills | Status: DC

## 2022-02-25 MED ORDER — ESTRADIOL 0.0375 MG/24HR TD PTWK: 0.0375 mg | MEDICATED_PATCH | TRANSDERMAL | 12 refills | Status: DC

## 2022-02-25 NOTE — Patient Instructions (Signed)
Increase your Vitamin D supplement to two times per week ? ?Climara patch for menopause  ? ? ?

## 2022-02-25 NOTE — Assessment & Plan Note (Addendum)
a1c has risen again since  Refills for Saxenda were denied in February due to inadequate weight loss.  ? ?Lab Results  ?Component Value Date  ? HGBA1C 5.8 01/10/2022  ? ? ?

## 2022-02-25 NOTE — Assessment & Plan Note (Signed)
Frequent hot flashes and vaginal dryness reported.  No c/i to estrogen.  Climara patch prescribed ?

## 2022-02-25 NOTE — Assessment & Plan Note (Signed)
Managed with Linzess.  

## 2022-02-25 NOTE — Assessment & Plan Note (Signed)
Level was very low despite weekly use of 50,000.  Secondary to bariatric surgery  Increase to 2/weekly ?

## 2022-02-25 NOTE — Assessment & Plan Note (Signed)
She has discontinued monthly injections despite a history of impaired absorption secondary to bariatric surgery.  Will check B12 at next visit. She is asymptomatic. ? ?Lab Results  ?Component Value Date  ? [id number] 236 07/23/2017  ? ? ?

## 2022-02-25 NOTE — Progress Notes (Signed)
? ?Subjective:  ?Patient ID: Kelly [name], female    DOB: Nov 07, 1966  Age: 56 y.o. MRN: [id number] ? ?CC: Diagnoses of Sciatica of left side, Menopause syndrome, Vitamin D deficiency, Vitamin B12 deficiency due to intestinal malabsorption, Impaired fasting glucose, and Irritable bowel syndrome with constipation were pertinent to this visit. ? ? ?This visit occurred during the SARS-CoV-2 public health emergency.  Safety protocols were in place, including screening questions prior to the visit, additional usage of staff PPE, and extensive cleaning of exam room while observing appropriate contact time as indicated for disinfecting solutions.   ? ?HPI ?[employer] presents for  ?Chief Complaint  ?Patient presents with  ? Follow-up  ?  Follow up on weight management  ? ? ?1) obesity:  has regained 70 lbs since her bariatric surgery nadir in 2017  when she reached a nadir of 130 lbs  .  Over the past several months she has lost 10 lbs intentionally, more she reports,  but has regained some due to snacking on chocolate and cookies. not exercising.  Planning to do more .   ? ?2) Hypertension: patient checks blood pressure twice weekly at home.  Readings have been for the most part < 140/80 at rest . Patient is following a reduced salt diet most days and is taking medications as prescribed  ? ?3) Menopause:  flushing has become bothersome. Wakes up  drenched in her pajamas.   Some vaginal dryness .  ? 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 ?Outpatient Medications Prior to Visit  ?Medication Sig Dispense Refill  ? LINZESS 145 MCG CAPS capsule Take 1 capsule (145 mcg total) by mouth daily. 30 capsule 5  ? Multiple Vitamin (MULTIVITAMIN) capsule Take 1 capsule by mouth daily.    ? ergocalciferol (DRISDOL) 1.25 MG (50000 UT) capsule Take 1 capsule (50,000 Units total) by mouth once a week. 12 capsule 0  ? gabapentin (NEURONTIN) 300 MG capsule  Take 1 capsule (300 mg total) by mouth 2 (two) times daily. 60 capsule 2  ? vitamin B-12 (CYANOCOBALAMIN) 1000 MCG tablet Take 1,000 mcg by mouth daily.    ? BD PEN NEEDLE NANO 2ND GEN 32G X 4 MM MISC  (Patient not taking: Reported on 08/23/2021)    ? losartan (COZAAR) 25 MG tablet Take 0.5 tablets (12.5 mg total) by mouth daily. (Patient not taking: Reported on 02/25/2022) 90 tablet 1  ? SAXENDA 18 MG/3ML SOPN INJECT 1.2 MG UNDER THE SKIN DAILY FOR 7 DAYS, 1.2 MG DAILY FOR 7 DAYS, 2.4 MG DAILY FOR 7 DAYS, THEN 3 MG DAILY FOR 7 DAYS (Patient not taking: Reported on 02/25/2022) 12 mL 0  ? sertraline (ZOLOFT) 50 MG tablet Take 1 tablet (50 mg total) by mouth at bedtime. (Patient not taking: Reported on 02/25/2022) 90 tablet 3  ? ?No facility-administered medications prior to visit.  ? ? ?Review of Systems; ? ?Patient denies headache, fevers, malaise, unintentional weight loss, skin rash, eye pain, sinus congestion and sinus pain, sore throat, dysphagia,  hemoptysis , cough, dyspnea, wheezing, chest pain, palpitations, orthopnea, edema, abdominal pain, nausea, melena, diarrhea, constipation, flank pain, dysuria, hematuria, urinary  Frequency, nocturia, numbness, tingling, seizures,  Focal weakness, Loss of consciousness,  Tremor, insomnia, depression, anxiety, and suicidal ideation.   ? ? ? ?Objective:  ?BP 122/88 (BP Location: Left Arm, Patient Position: Sitting, Cuff Size: Large)   Pulse 99  Temp 98.4 ?F (36.9 ?C) (Oral)   Ht 5\' 5"  (1.651 m)   Wt 193 lb 12.8 oz (87.9 kg)   SpO2 97%   BMI 32.25 kg/m?  ? ?BP Readings from Last 3 Encounters:  ?02/25/22 122/88  ?10/07/21 136/88  ?08/23/21 132/90  ? ? ?Wt Readings from Last 3 Encounters:  ?02/25/22 193 lb 12.8 oz (87.9 kg)  ?10/07/21 202 lb 12.8 oz (92 kg)  ?08/23/21 202 lb (91.6 kg)  ? ? ?General appearance: alert, cooperative and appears stated age ?Ears: normal TM's and external ear canals both ears ?Throat: lips, mucosa, and tongue normal; teeth and gums normal ?Neck:  no adenopathy, no carotid bruit, supple, symmetrical, trachea midline and thyroid not enlarged, symmetric, no tenderness/mass/nodules ?Back: symmetric, no curvature. ROM normal. No CVA tenderness. ?Lungs: clear to auscultation bilaterally ?Heart: regular rate and rhythm, S1, S2 normal, no murmur, click, rub or gallop ?Abdomen: soft, non-tender; bowel sounds normal; no masses,  no organomegaly ?Pulses: 2+ and symmetric ?Skin: Skin color, texture, turgor normal. No rashes or lesions ?Lymph nodes: Cervical, supraclavicular, and axillary nodes normal. ? ?Lab Results  ?Component Value Date  ? HGBA1C 5.8 01/10/2022  ? HGBA1C 5.9 08/23/2021  ? HGBA1C 5.3 05/10/2020  ? ? ?Lab Results  ?Component Value Date  ? CREATININE 0.61 01/10/2022  ? CREATININE 0.61 01/10/2022  ? CREATININE 0.62 10/07/2021  ? ? ?Lab Results  ?Component Value Date  ? WBC 4.4 08/23/2021  ? HGB 12.3 08/23/2021  ? HCT 38.1 08/23/2021  ? PLT 173.0 08/23/2021  ? GLUCOSE 102 (H) 01/10/2022  ? GLUCOSE 102 (H) 01/10/2022  ? CHOL 200 10/07/2021  ? TRIG 84.0 10/07/2021  ? HDL 83.40 10/07/2021  ? LDLCALC 100 (H) 10/07/2021  ? ALT 14 01/10/2022  ? AST 15 01/10/2022  ? NA 141 01/10/2022  ? NA 141 01/10/2022  ? K 3.9 01/10/2022  ? K 3.9 01/10/2022  ? CL 105 01/10/2022  ? CL 105 01/10/2022  ? CREATININE 0.61 01/10/2022  ? CREATININE 0.61 01/10/2022  ? BUN 16 01/10/2022  ? BUN 16 01/10/2022  ? CO2 31 01/10/2022  ? CO2 31 01/10/2022  ? TSH 1.97 10/07/2021  ? HGBA1C 5.8 01/10/2022  ? MICROALBUR 1.0 05/10/2020  ? ? ?MM 3D SCREEN BREAST BILATERAL ? ?Result Date: 09/18/2021 ?CLINICAL DATA:  Screening. EXAM: DIGITAL SCREENING BILATERAL MAMMOGRAM WITH TOMOSYNTHESIS AND CAD TECHNIQUE: Bilateral screening digital craniocaudal and mediolateral oblique mammograms were obtained. Bilateral screening digital breast tomosynthesis was performed. The images were evaluated with computer-aided detection. COMPARISON:  Previous exam(s). ACR Breast Density Category c: The breast tissue is  heterogeneously dense, which may obscure small masses. FINDINGS: There are no findings suspicious for malignancy. IMPRESSION: No mammographic evidence of malignancy. A result letter of this screening mammogram will be mailed directly to the patient. RECOMMENDATION: Screening mammogram in one year. (Code:SM-B-01Y) BI-RADS CATEGORY  1: Negative. Electronically Signed   By: Meda Klinefelter M.D.   On: 09/18/2021 08:53 ? ? ?Assessment & Plan:  ? ?Problem List Items Addressed This Visit   ? ? Impaired fasting glucose  ?  a1c has risen again since  Refills for Saxenda were denied in February due to inadequate weight loss.  ? ?Lab Results  ?Component Value Date  ? HGBA1C 5.8 01/10/2022  ? ? ?  ?  ? Irritable bowel syndrome  ?  Managed with Linzess.  ?  ?  ? Menopause syndrome  ?  Frequent hot flashes and vaginal dryness reported.  No c/i to estrogen.  Climara patch prescribed ?  ?  ? Sciatica of left side  ? Relevant Medications  ? gabapentin (NEURONTIN) 300 MG capsule  ? Vitamin B12 deficiency due to intestinal malabsorption  ?  She has discontinued monthly injections despite a history of impaired absorption secondary to bariatric surgery.  Will check B12 at next visit. She is asymptomatic. ? ?Lab Results  ?Component Value Date  ? [id number] 236 07/23/2017  ? ? ?  ?  ? Vitamin D deficiency  ?  Level was very low despite weekly use of 50,000.  Secondary to bariatric surgery  Increase to 2/weekly ?  ?  ? ? ?I spent 40 minutes dedicated to the care of this patient on the date of this encounter to include pre-visit review of patient's medical history,  most recent imaging studies, Face-to-face time with the patient , and post visit ordering of testing and therapeutics.   ? ?Follow-up: Return in about 6 months (around 08/28/2022). ? ? ?[name], MD ?

## 2022-03-20 ENCOUNTER — Telehealth: Payer: Self-pay

## 2022-03-20 NOTE — Telephone Encounter (Signed)
Saxenda denied - does not meet approved criteria set forth by coverage plan listed below. ? ?-18 years or older ?-Lost at least 4% of body weight ?-Continuing to keep initial 4% of total weight loss off ?

## 2022-05-14 ENCOUNTER — Other Ambulatory Visit: Payer: Self-pay | Admitting: Internal Medicine

## 2022-05-14 NOTE — Telephone Encounter (Signed)
Refilled: 02/27/2022 Last OV: 02/25/2022 Next OV: 09/02/2022  Last Vit D level: 01/10/2022

## 2022-08-11 ENCOUNTER — Other Ambulatory Visit: Payer: Self-pay | Admitting: Internal Medicine

## 2022-08-21 ENCOUNTER — Other Ambulatory Visit: Payer: Self-pay | Admitting: Family

## 2022-08-21 DIAGNOSIS — I10 Essential (primary) hypertension: Secondary | ICD-10-CM

## 2022-08-21 DIAGNOSIS — F419 Anxiety disorder, unspecified: Secondary | ICD-10-CM

## 2022-09-02 ENCOUNTER — Ambulatory Visit: Payer: BC Managed Care – PPO | Admitting: Internal Medicine

## 2022-10-22 ENCOUNTER — Ambulatory Visit (INDEPENDENT_AMBULATORY_CARE_PROVIDER_SITE_OTHER): Payer: BC Managed Care – PPO | Admitting: Urology

## 2022-10-22 ENCOUNTER — Ambulatory Visit
Admission: RE | Admit: 2022-10-22 | Discharge: 2022-10-22 | Disposition: A | Discharge status: Home / Self Care | Payer: BC Managed Care – PPO | Source: Ambulatory Visit | Attending: Urology | Admitting: Urology

## 2022-10-22 ENCOUNTER — Encounter: Payer: Self-pay | Admitting: Urology

## 2022-10-22 VITALS — BP 167/103 | HR 76 | Ht 65.0 in | Wt 192.0 lb

## 2022-10-22 DIAGNOSIS — N390 Urinary tract infection, site not specified: Secondary | ICD-10-CM

## 2022-10-22 DIAGNOSIS — R319 Hematuria, unspecified: Secondary | ICD-10-CM

## 2022-10-22 DIAGNOSIS — Z87440 Personal history of urinary (tract) infections: Secondary | ICD-10-CM | POA: Diagnosis not present

## 2022-10-22 LAB — URINALYSIS, COMPLETE
Bilirubin, UA: NEGATIVE
Glucose, UA: NEGATIVE
Ketones, UA: NEGATIVE
Leukocytes,UA: NEGATIVE
Nitrite, UA: NEGATIVE
Protein,UA: NEGATIVE
Specific Gravity, UA: 1.015 (ref 1.005–1.030)
Urobilinogen, Ur: 0.2 mg/dL (ref 0.2–1.0)
pH, UA: 6.5 (ref 5.0–7.5)

## 2022-10-22 LAB — MICROSCOPIC EXAMINATION

## 2022-10-22 MED ORDER — ESTRADIOL 0.1 MG/GM VA CREA: TOPICAL_CREAM | VAGINAL | 3 refills | Status: DC

## 2022-10-22 MED ORDER — TRIMETHOPRIM 100 MG PO TABS: 100.0000 mg | ORAL_TABLET | Freq: Every day | ORAL | 0 refills | Status: DC

## 2022-10-22 NOTE — Addendum Note (Signed)
Addended by: Despina Hidden on: 10/22/2022 01:44 PM   Modules accepted: Orders

## 2022-10-22 NOTE — Patient Instructions (Signed)
Recommend taking cranberry tablets 1-2 times daily to help prevent infections, use the topical estrogen cream once daily for the first week, then this can be spaced to just 3 times a week.  This helps the opening of the bladder prevent infections, and has very little to no risk.  We also will put you on a low-dose antibiotic for 3 months to prevent any infections as the topical estrogen cream starts to work.  We will call you with your x-ray results from today.

## 2022-10-22 NOTE — Progress Notes (Signed)
10/22/22 1:17 PM   Kelly [name] [birth year] [id number]  CC: Recurrent UTI  HPI: 56 year old female with referred for recurrent urinary tract infections.  She reports about 3-4 urinary infections over the last year, and most of these has been treated in urgent care so cultures are not available.  Her UTI symptoms are dysuria, gross hematuria, and low back pain.  Most recent UTI was 10/14/2022 and culture grew E. coli and Klebsiella which was sensitive to the Cipro she was prescribed.  She reports this resolved her urinary symptoms.  She denies any gross hematuria not at the time of infection.  Past medical history is notable for gastric bypass in 2016.  She denies any stone episodes since that time.   PMH: Past Medical History:  Diagnosis Date   Chest pain 2002   abnormal EKG, negative treadmill stress test/Holter myoview, Dr. Humphrey Rolls    Surgical History: Past Surgical History:  Procedure Laterality Date   ABDOMINAL HYSTERECTOMY  35   secondary to post partum hemorrhage causing anemia   GASTRIC BYPASS  2016   SPINE SURGERY     lumbar spine, L4-L5 ruptured disc    Family History: Family History  Problem Relation Age of Onset   Hyperlipidemia Mother    Arthritis Mother    Hypertension Mother    Hypertension Father    Heart disease Father    Heart disease Maternal Uncle    Coronary artery disease Maternal Uncle    Diabetes Paternal Aunt    Coronary artery disease Maternal Grandfather    Heart attack Maternal Grandfather    Breast cancer Maternal Aunt 45   Colon cancer Maternal Grandmother    Heart disease Paternal Grandmother    Diabetes Brother    Hyperlipidemia Brother    Hypertension Brother    Heart disease Brother     Social History:  reports that she has never smoked. She has never been exposed to tobacco smoke. She has never used smokeless tobacco. She reports that she does not currently use alcohol. She reports that she does not use drugs.  Physical  Exam: BP (!) 167/103   Pulse 76   Ht 5\' 5"  (1.651 m)   Wt 192 lb (87.1 kg)   BMI 31.95 kg/m    Constitutional:  Alert and oriented, No acute distress. Cardiovascular: No clubbing, cyanosis, or edema. Respiratory: Normal respiratory effort, no increased work of breathing. GI: Abdomen is soft, nontender, nondistended, no abdominal masses  Laboratory Data: Reviewed, see HPI  Pertinent Imaging: I have personally viewed and interpreted the CT stone protocol from October 2017 with no evidence of stones or hydronephrosis  Assessment & Plan:   56 year old female with reported 3-4 UTIs over the last year, history also notable for gastric bypass in 2016 but denies any stone episodes since that time.  We discussed the evaluation and treatment of patients with recurrent UTIs at length.  We specifically discussed the differences between asymptomatic bacteriuria and true urinary tract infection.  We discussed the AUA definition of recurrent UTI of at least 2 culture proven symptomatic acute cystitis episodes in a 11-month period, or 3 within a 1 year period.  We discussed the importance of culture directed antibiotic treatment, and antibiotic stewardship.  First-line therapy includes nitrofurantoin(5 days), Bactrim(3 days), or fosfomycin(3 g single dose).  Possible etiologies of recurrent infection include periurethral tissue atrophy in postmenopausal woman, constipation, sexual activity, incomplete emptying, anatomic abnormalities, and even genetic predisposition.  Finally, we discussed the role of perineal hygiene,  timed voiding, adequate hydration, topical vaginal estrogen, cranberry prophylaxis, and low-dose antibiotic prophylaxis.  -We also discussed the correlation between gastric bypass and nephrolithiasis, and I recommended a KUB today to rule out any kidney stones -Trimethoprim prophylaxis x90 days, start topical estrogen cream, cranberry tablets twice daily -RTC 4 months symptom check   Legrand Rams, MD 10/22/2022  [facility] [address] [place], [place] [address] [phone]

## 2022-10-28 ENCOUNTER — Other Ambulatory Visit: Payer: Self-pay

## 2022-10-28 DIAGNOSIS — R3129 Other microscopic hematuria: Secondary | ICD-10-CM

## 2022-10-28 DIAGNOSIS — N390 Urinary tract infection, site not specified: Secondary | ICD-10-CM

## 2022-10-28 DIAGNOSIS — R319 Hematuria, unspecified: Secondary | ICD-10-CM

## 2022-11-11 ENCOUNTER — Ambulatory Visit
Admission: RE | Admit: 2022-11-11 | Discharge: 2022-11-11 | Disposition: A | Discharge status: Home / Self Care | Payer: BC Managed Care – PPO | Source: Ambulatory Visit | Attending: Urology | Admitting: Urology

## 2022-11-11 DIAGNOSIS — R3129 Other microscopic hematuria: Secondary | ICD-10-CM | POA: Insufficient documentation

## 2022-11-11 DIAGNOSIS — N390 Urinary tract infection, site not specified: Secondary | ICD-10-CM | POA: Insufficient documentation

## 2022-11-18 ENCOUNTER — Telehealth: Payer: Self-pay

## 2022-11-18 NOTE — Telephone Encounter (Signed)
-----   Message from Sondra Come, MD sent at 11/17/2022  8:01 AM EST ----- Good news, renal US normal, keep follow up as scheduled  Legrand Rams, MD 11/17/2022

## 2022-11-18 NOTE — Telephone Encounter (Signed)
Called pt no answer. LM for pt informing her of the information below.  

## 2023-02-19 ENCOUNTER — Ambulatory Visit: Payer: BC Managed Care – PPO | Admitting: Urology

## 2023-02-23 ENCOUNTER — Emergency Department (HOSPITAL_BASED_OUTPATIENT_CLINIC_OR_DEPARTMENT_OTHER): Payer: BC Managed Care – PPO

## 2023-02-23 ENCOUNTER — Other Ambulatory Visit: Payer: Self-pay

## 2023-02-23 ENCOUNTER — Ambulatory Visit
Admission: EM | Admit: 2023-02-23 | Discharge: 2023-02-23 | Disposition: A | Discharge status: Home / Self Care | Payer: BC Managed Care – PPO | Attending: Family Medicine | Admitting: Family Medicine

## 2023-02-23 ENCOUNTER — Emergency Department (HOSPITAL_BASED_OUTPATIENT_CLINIC_OR_DEPARTMENT_OTHER)
Admission: EM | Admit: 2023-02-23 | Discharge: 2023-02-23 | Disposition: A | Discharge status: Home / Self Care | Payer: BC Managed Care – PPO | Attending: Emergency Medicine | Admitting: Emergency Medicine

## 2023-02-23 DIAGNOSIS — R1011 Right upper quadrant pain: Secondary | ICD-10-CM | POA: Insufficient documentation

## 2023-02-23 DIAGNOSIS — Z91040 Latex allergy status: Secondary | ICD-10-CM | POA: Insufficient documentation

## 2023-02-23 DIAGNOSIS — R1031 Right lower quadrant pain: Secondary | ICD-10-CM | POA: Insufficient documentation

## 2023-02-23 DIAGNOSIS — R1013 Epigastric pain: Secondary | ICD-10-CM | POA: Diagnosis present

## 2023-02-23 DIAGNOSIS — R112 Nausea with vomiting, unspecified: Secondary | ICD-10-CM | POA: Diagnosis not present

## 2023-02-23 DIAGNOSIS — R197 Diarrhea, unspecified: Secondary | ICD-10-CM | POA: Insufficient documentation

## 2023-02-23 DIAGNOSIS — R109 Unspecified abdominal pain: Secondary | ICD-10-CM

## 2023-02-23 LAB — CBC WITH DIFFERENTIAL/PLATELET
Abs Immature Granulocytes: 0.04 10*3/uL (ref 0.00–0.07)
Basophils Absolute: 0 10*3/uL (ref 0.0–0.1)
Basophils Relative: 0 %
Eosinophils Absolute: 0 10*3/uL (ref 0.0–0.5)
Eosinophils Relative: 1 %
HCT: 39.3 % (ref 36.0–46.0)
Hemoglobin: 12.8 g/dL (ref 12.0–15.0)
Immature Granulocytes: 1 %
Lymphocytes Relative: 21 %
Lymphs Abs: 1.8 10*3/uL (ref 0.7–4.0)
MCH: 28.1 pg (ref 26.0–34.0)
MCHC: 32.6 g/dL (ref 30.0–36.0)
MCV: 86.2 fL (ref 80.0–100.0)
Monocytes Absolute: 0.4 10*3/uL (ref 0.1–1.0)
Monocytes Relative: 5 %
Neutro Abs: 6.1 10*3/uL (ref 1.7–7.7)
Neutrophils Relative %: 72 %
Platelets: 237 10*3/uL (ref 150–400)
RBC: 4.56 MIL/uL (ref 3.87–5.11)
RDW: 13.6 % (ref 11.5–15.5)
WBC: 8.4 10*3/uL (ref 4.0–10.5)
nRBC: 0 % (ref 0.0–0.2)

## 2023-02-23 LAB — COMPREHENSIVE METABOLIC PANEL
ALT: 16 U/L (ref 0–44)
AST: 19 U/L (ref 15–41)
Albumin: 3.7 g/dL (ref 3.5–5.0)
Alkaline Phosphatase: 89 U/L (ref 38–126)
Anion gap: 9 (ref 5–15)
BUN: 12 mg/dL (ref 6–20)
CO2: 24 mmol/L (ref 22–32)
Calcium: 8.4 mg/dL — ABNORMAL LOW (ref 8.9–10.3)
Chloride: 103 mmol/L (ref 98–111)
Creatinine, Ser: 0.61 mg/dL (ref 0.44–1.00)
GFR, Estimated: >60 mL/min (ref >60)
Glucose, Bld: 108 mg/dL — ABNORMAL HIGH (ref 70–99)
Potassium: 3.8 mmol/L (ref 3.5–5.1)
Sodium: 136 mmol/L (ref 135–145)
Total Bilirubin: 0.6 mg/dL (ref 0.3–1.2)
Total Protein: 7.9 g/dL (ref 6.5–8.1)

## 2023-02-23 LAB — URINALYSIS, ROUTINE W REFLEX MICROSCOPIC
Bilirubin Urine: NEGATIVE
Glucose, UA: NEGATIVE mg/dL
Hgb urine dipstick: NEGATIVE
Ketones, ur: NEGATIVE mg/dL
Leukocytes,Ua: NEGATIVE
Nitrite: NEGATIVE
Specific Gravity, Urine: 1.034 — ABNORMAL HIGH (ref 1.005–1.030)
pH: 5.5 (ref 5.0–8.0)

## 2023-02-23 LAB — LIPASE, BLOOD: Lipase: 39 U/L (ref 11–51)

## 2023-02-23 MED ORDER — ONDANSETRON 4 MG PO TBDP: 4.0000 mg | ORAL_TABLET | Freq: Three times a day (TID) | ORAL | 0 refills | Status: AC | PRN

## 2023-02-23 MED ORDER — LIDOCAINE VISCOUS HCL 2 % MT SOLN
15.0000 mL | Freq: Once | OROMUCOSAL | Status: AC
  Administered 2023-02-23: 15 mL via OROMUCOSAL

## 2023-02-23 MED ORDER — ALUM & MAG HYDROXIDE-SIMETH 200-200-20 MG/5ML PO SUSP
30.0000 mL | Freq: Once | ORAL | Status: AC
  Administered 2023-02-23: 30 mL via ORAL

## 2023-02-23 MED ORDER — SODIUM CHLORIDE 0.9 % IV BOLUS
1000.0000 mL | Freq: Once | INTRAVENOUS | Status: AC
  Administered 2023-02-23: 1000 mL via INTRAVENOUS

## 2023-02-23 MED ORDER — SIMETHICONE 80 MG PO CHEW: 80.0000 mg | CHEWABLE_TABLET | Freq: Four times a day (QID) | ORAL | 0 refills | Status: AC | PRN

## 2023-02-23 MED ORDER — ONDANSETRON HCL 4 MG/2ML IJ SOLN
4.0000 mg | Freq: Once | INTRAMUSCULAR | Status: AC
  Administered 2023-02-23: 4 mg via INTRAVENOUS
  Filled 2023-02-23: qty 2

## 2023-02-23 MED ORDER — IOHEXOL 350 MG/ML SOLN
100.0000 mL | Freq: Once | INTRAVENOUS | Status: AC | PRN
  Administered 2023-02-23: 80 mL via INTRAVENOUS

## 2023-02-23 MED ORDER — FAMOTIDINE 20 MG PO TABS: 20.0000 mg | ORAL_TABLET | Freq: Two times a day (BID) | ORAL | 0 refills | Status: DC

## 2023-02-23 MED ORDER — OMEPRAZOLE 40 MG PO CPDR: 40.0000 mg | DELAYED_RELEASE_CAPSULE | Freq: Every day | ORAL | 0 refills | Status: DC

## 2023-02-23 MED ORDER — FENTANYL CITRATE PF 50 MCG/ML IJ SOSY
50.0000 ug | PREFILLED_SYRINGE | Freq: Once | INTRAMUSCULAR | Status: AC
  Administered 2023-02-23: 50 ug via INTRAVENOUS
  Filled 2023-02-23: qty 1

## 2023-02-23 NOTE — ED Provider Notes (Signed)
[facility] Provider Note   CSN: [id number] Arrival date & time: 02/23/23  1608     History  Chief Complaint  Patient presents with   Abdominal Pain    Kelly [name] is a 57 y.o. female, history of gastric bypass, who presents to the ED secondary to her right upper quadrant pain has been going on for the last couple days, then had nausea and vomiting last night, she has had 10+ episodes of vomiting, and a smidge of diarrhea, and has not been able to eat or drink much.  Was seen in [place], and instructed to go to the ER for CT scan.  She denies any fevers, chills, chest discomfort, or shortness of breath.  States the pain is stabbing, she has had pains like this before, but this is the worst it has ever been.  She notes that they would come and go, but now it is persistent.     Home Medications Prior to Admission medications   Medication Sig Start Date End Date Taking? Authorizing Provider  ondansetron (ZOFRAN-ODT) 4 MG disintegrating tablet Take 1 tablet (4 mg total) by mouth every 8 (eight) hours as needed for nausea or vomiting. 02/23/23  Yes Ellarae Nevitt L, PA  estradiol (ESTRACE) 0.1 MG/GM vaginal cream Estrogen Cream Instruction Discard applicator Apply pea sized amount to tip of finger to urethra before bed. Wash hands well after application. Use Monday, Wednesday and Friday 10/22/22   Billey Co, MD  famotidine (PEPCID) 20 MG tablet Take 1 tablet (20 mg total) by mouth 2 (two) times daily. 02/23/23   Brimage, Ronnette Juniper, DO  gabapentin (NEURONTIN) 300 MG capsule Take 1 capsule (300 mg total) by mouth 2 (two) times daily. 02/25/22   Crecencio Mc, MD  LINZESS 145 MCG CAPS capsule Take 1 capsule (145 mcg total) by mouth daily. 10/07/21   Crecencio Mc, MD  Multiple Vitamin (MULTIVITAMIN) capsule Take 1 capsule by mouth daily.    [provider]  omeprazole (PRILOSEC) 40 MG capsule Take 1 capsule (40 mg total) by  mouth daily. 02/23/23   Lyndee Hensen, DO  simethicone (GAS-X) 80 MG chewable tablet Chew 1 tablet (80 mg total) by mouth every 6 (six) hours as needed for flatulence. 02/23/23   Lyndee Hensen, DO  trimethoprim (TRIMPEX) 100 MG tablet Take 1 tablet (100 mg total) by mouth daily. 10/22/22   Billey Co, MD  Vitamin D, Ergocalciferol, (DRISDOL) 1.25 MG (50000 UNIT) CAPS capsule TAKE 1 CAPSULE (50,000 UNITS TOTAL) BY MOUTH TWO TIMES A WEEK 05/14/22   Crecencio Mc, MD      Allergies    Codeine and Latex    Review of Systems   Review of Systems  Cardiovascular:  Negative for chest pain.  Gastrointestinal:  Positive for abdominal pain, diarrhea, nausea and vomiting. Negative for abdominal distention.    Physical Exam Updated Vital Signs BP (!) 140/91   Pulse 74   Temp 98.5 F (36.9 C)   Resp 18   Ht '5\' 5"'$  (1.651 m)   Wt 85.7 kg   SpO2 98%   BMI 31.45 kg/m  Physical Exam Vitals and nursing note reviewed.  Constitutional:      General: She is not in acute distress.    Appearance: She is well-developed.  HENT:     Head: Normocephalic and atraumatic.  Eyes:     Conjunctiva/sclera: Conjunctivae normal.  Cardiovascular:     Rate and Rhythm:  Normal rate and regular rhythm.     Heart sounds: No murmur heard. Pulmonary:     Effort: Pulmonary effort is normal. No respiratory distress.     Breath sounds: Normal breath sounds.  Abdominal:     Palpations: Abdomen is soft.     Tenderness: There is abdominal tenderness in the right upper quadrant. There is guarding. There is no rebound.  Musculoskeletal:        General: No swelling.     Cervical back: Neck supple.  Skin:    General: Skin is warm and dry.     Capillary Refill: Capillary refill takes less than 2 seconds.  Neurological:     Mental Status: She is alert.  Psychiatric:        Mood and Affect: Mood normal.     ED Results / Procedures / Treatments   Labs (all labs ordered are listed, but only abnormal results are  displayed) Labs Reviewed  URINALYSIS, ROUTINE W REFLEX MICROSCOPIC - Abnormal; Notable for the following components:      Result Value   Specific Gravity, Urine 1.034 (*)    Protein, ur TRACE (*)    All other components within normal limits    EKG None  Radiology CT ABDOMEN PELVIS W CONTRAST  Result Date: 02/23/2023 CLINICAL DATA:  Right abdominal pain with nausea vomiting and diarrhea. EXAM: CT ABDOMEN AND PELVIS WITH CONTRAST TECHNIQUE: Multidetector CT imaging of the abdomen and pelvis was performed using the standard protocol following bolus administration of intravenous contrast. RADIATION DOSE REDUCTION: This exam was performed according to the departmental dose-optimization program which includes automated exposure control, adjustment of the mA and/or kV according to patient size and/or use of iterative reconstruction technique. CONTRAST:  78m OMNIPAQUE IOHEXOL 350 MG/ML SOLN COMPARISON:  Hepatobiliary ultrasound of 02/23/2023 and renal ultrasound of 11/11/2022 in addition to CT scan from 10/04/2016 FINDINGS: Lower chest: Wilbert Schouten type 1 hiatal hernia. Hepatobiliary: Unremarkable Pancreas: Unremarkable Spleen: Unremarkable Adrenals/Urinary Tract: Unremarkable Stomach/Bowel: Gastric bypass with patent gastrojejunostomy. Normal appendix. There is formed stool in the distal colon. No dilated bowel. Vascular/Lymphatic: Unremarkable Reproductive: Uterus absent.  Adnexa unremarkable. Other: No supplemental non-categorized findings. Musculoskeletal: 3 mm grade 1 degenerative anterolisthesis at L4-5. Lumbar spondylosis and degenerative disc disease cause substantial bilateral foraminal impingement at L5-S1 along with moderate left and mild right foraminal impingement at L4-5. IMPRESSION: 1. A specific cause for the patient's right abdominal pain is not identified. The appendix appears normal. 2. Marylan Glore type 1 hiatal hernia. 3. Gastric bypass with patent gastrojejunostomy. 4. Lumbar spondylosis and  degenerative disc disease causing impingement at L4-5 and L5-S1. Electronically Signed   By: WVan ClinesM.D.   On: 02/23/2023 20:15   UKoreaAbdomen Limited RUQ (LIVER/GB)  Result Date: 02/23/2023 CLINICAL DATA:  Right upper quadrant pain, nausea, vomiting EXAM: ULTRASOUND ABDOMEN LIMITED RIGHT UPPER QUADRANT COMPARISON:  None Available. FINDINGS: Gallbladder: No gallstones or wall thickening visualized. No sonographic Murphy sign noted by sonographer. Common bile duct: Diameter: Normal caliber, 3 mm Liver: No focal lesion identified. Within normal limits in parenchymal echogenicity. Portal vein is patent on color Doppler imaging with normal direction of blood flow towards the liver. Other: None. IMPRESSION: Unremarkable study. Electronically Signed   By: KRolm BaptiseM.D.   On: 02/23/2023 19:20    Procedures Procedures    Medications Ordered in ED Medications  sodium chloride 0.9 % bolus 1,000 mL (1,000 mLs Intravenous New Bag/Given 02/23/23 1835)  ondansetron (ZOFRAN) injection 4 mg (4 mg Intravenous Given  02/23/23 1836)  fentaNYL (SUBLIMAZE) injection 50 mcg (50 mcg Intravenous Given 02/23/23 1836)  iohexol (OMNIPAQUE) 350 MG/ML injection 100 mL (80 mLs Intravenous Contrast Given 02/23/23 1935)    ED Course/ Medical Decision Making/ A&P                             Medical Decision Making Patient is a 57 year old female, here for nausea, vomiting, diarrhea, and right upper and right lower quadrant pain that is been going on for the last 2 days.  Had normal labs at the urgent care today, so was sent to ER for imaging.  She does have quite a bit of tenderness to the right upper quadrant, thus we will obtain ultrasound to evaluate for cholecystitis.  As well as give her fluids, and nausea medication.  Amount and/or Complexity of Data Reviewed Labs: ordered.    Details: Labs done at urgent care unremarkable. Radiology: ordered.    Details: CT abdomen pelvis, unremarkable, ultrasound  unremarkable. Discussion of management or test interpretation with external provider(s): Discussed with patient, no acute findings for the abdominal discomfort, she may have a GI bug get secondary to the nausea, vomiting, diarrhea, and may just be causing some inflammation of the right guide.  It is unclear what the causes, but we discussed return precautions and she will follow-up with her primary care.  Doctor.  Zofran prescribed.  We discussed if she is unable to eat or drink, or the pain is worsening to return.  She is overall well-appearing at discharge, and agreeable with plan.  Risk Prescription drug management.    Final Clinical Impression(s) / ED Diagnoses Final diagnoses:  Right sided abdominal pain  Nausea vomiting and diarrhea    Rx / DC Orders ED Discharge Orders          Ordered    ondansetron (ZOFRAN-ODT) 4 MG disintegrating tablet  Every 8 hours PRN        02/23/23 2044              Osvaldo Shipper, [place] 02/23/23 2047    Wyvonnia Dusky, MD 02/23/23 2320

## 2023-02-23 NOTE — Discharge Instructions (Addendum)
Today your CAT scan and your ultrasound were unremarkable, please follow-up with your primary care doctor.  Take the nausea medication as prescribed, and make sure you drink lots of fluids.  If you have worsening abdominal pain, chest pain, shortness of breath or unable to keep anything down please return to the ER.

## 2023-02-23 NOTE — ED Notes (Signed)
Discharge instructions, follow up care, and prescription reviewed and explained, pt verbalized understanding. Pt caox4 and NAD on d/c.

## 2023-02-23 NOTE — ED Provider Notes (Signed)
[facility]    CSN: [id number] Arrival date & time: 02/23/23  1136      History   Chief Complaint Chief Complaint  Patient presents with   Abdominal Pain   Nausea   Bloated    HPI Kelly [name] is a 57 y.o. female.   HPI  Kelly [name] presents for epigastric abdominal pain that started on Saturday.  Has stabbing pain and rated 8/10.  Last night, pain 10/10. She had vomiting and diarrhea. Has sinus issues as well. Has bloating. Has been burping. Had gastric bypass 6 years ago. Has not had acid reflux since her gastric surgery. Had H. Pylori and was treated.   Pain radiates across the abdomen. Laying on her left side makes pain worse.  Being on her hands and knees makes the pain better. Took nothing for pain.    Had a normal colonoscopy 2 years.   Past Surgeries: gastric bypass, hysterectomy   Symptoms Nausea/Vomiting: yes  Diarrhea: yes  Constipation: no  Melena/BRBPR: no  Hematemesis: no  Anorexia: yes  Fever/Chills: yes  Dysuria: no  Rash: no  Wt loss: no  EtOH use: no  NSAIDs/ASA: no  Vaginal bleeding: hysterectomy  Sore throat: no   Cough: no Chest pain: no  Nasal congestion : yes Sleep disturbance: yes Back Pain: yes Headache: yes  Past Medical History:  Diagnosis Date   Chest pain 2002   abnormal EKG, negative treadmill stress test/Holter myoview, Dr. 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   Bilateral hand pain 01/04/2019   Impingement syndrome of shoulder region 10/16/2017   Osteoarthritis of knee 10/16/2017   Knee pain, bilateral 10/03/2017   Insomnia 10/03/2017   Chest pain at rest 07/23/2017   Vitamin B12 deficiency due to intestinal malabsorption 11/06/2016   Vitamin D deficiency 11/06/2016    S/P bariatric surgery 11/06/2016   Postoperative malabsorption 03/04/2016   Impaired fasting glucose 06/18/2015   GERD (gastroesophageal reflux disease) 06/17/2015   Lumbar spine strain, initial encounter 05/30/2015   Spondylosis of lumbar region without myelopathy or radiculopathy 05/30/2015   Essential hypertension 10/21/2014   Encounter for general adult medical examination with abnormal findings 09/19/2014   S/P hysterectomy with oophorectomy 09/19/2014   Absence of both cervix and uterus, acquired 09/19/2014   Generalized anxiety disorder 09/18/2014   Snoring 09/18/2014   Irritable bowel syndrome 11/11/2013   Shoulder pain, right 11/11/2013   Menopause ovarian failure 09/13/2012   Migraine headache without aura 07/27/2012   Other chronic allergic conjunctivitis 07/27/2012   Sciatica of left side 01/08/2012   Obesity (BMI 30.0-34.9) 12/31/2011    Past Surgical History:  Procedure Laterality Date   ABDOMINAL HYSTERECTOMY  35   secondary to post partum hemorrhage causing anemia   GASTRIC BYPASS  2016   SPINE SURGERY     lumbar spine, L4-L5 ruptured disc    OB History   No obstetric history on file.      Home Medications    Prior to Admission medications   Medication Sig Start Date End Date Taking? Authorizing Provider  estradiol (ESTRACE) 0.1 MG/GM vaginal cream Estrogen Cream Instruction Discard applicator Apply pea sized amount to tip of finger to urethra before bed. Wash hands well after application. Use Monday, Wednesday and  Friday 10/22/22  Yes Billey Co, MD  famotidine (PEPCID) 20 MG tablet Take 1 tablet (20 mg total) by mouth 2 (two) times daily. 02/23/23  Yes Rodric Punch, DO  gabapentin (NEURONTIN) 300 MG capsule Take 1 capsule (300 mg total) by mouth 2 (two) times daily. 02/25/22  Yes Crecencio Mc, MD  LINZESS 145 MCG CAPS capsule Take 1 capsule (145 mcg total) by mouth daily. 10/07/21  Yes Crecencio Mc, MD  Multiple Vitamin (MULTIVITAMIN) capsule  Take 1 capsule by mouth daily.   Yes [provider]  omeprazole (PRILOSEC) 40 MG capsule Take 1 capsule (40 mg total) by mouth daily. 02/23/23  Yes Jannice Beitzel, DO  simethicone (GAS-X) 80 MG chewable tablet Chew 1 tablet (80 mg total) by mouth every 6 (six) hours as needed for flatulence. 02/23/23  Yes Reinaldo Helt, DO  trimethoprim (TRIMPEX) 100 MG tablet Take 1 tablet (100 mg total) by mouth daily. 10/22/22  Yes Billey Co, MD  Vitamin D, Ergocalciferol, (DRISDOL) 1.25 MG (50000 UNIT) CAPS capsule TAKE 1 CAPSULE (50,000 UNITS TOTAL) BY MOUTH TWO TIMES A WEEK 05/14/22  Yes Crecencio Mc, MD  ondansetron (ZOFRAN-ODT) 4 MG disintegrating tablet Take 1 tablet (4 mg total) by mouth every 8 (eight) hours as needed for nausea or vomiting. 02/23/23   Small, Si Gaul, PA    Family History Family History  Problem Relation Age of Onset   Hyperlipidemia Mother    Arthritis Mother    Hypertension Mother    Hypertension Father    Heart disease Father    Heart disease Maternal Uncle    Coronary artery disease Maternal Uncle    Diabetes Paternal Aunt    Coronary artery disease Maternal Grandfather    Heart attack Maternal Grandfather    Breast cancer Maternal Aunt 45   Colon cancer Maternal Grandmother    Heart disease Paternal Grandmother    Diabetes Brother    Hyperlipidemia Brother    Hypertension Brother    Heart disease Brother     Social History Social History   Tobacco Use   Smoking status: Never    Passive exposure: Never   Smokeless tobacco: Never  Vaping Use   Vaping Use: Former   Quit date: 07/23/2016  Substance Use Topics   Alcohol use: Not Currently    Alcohol/week: 0.0 standard drinks of alcohol   Drug use: No     Allergies   Codeine and Latex   Review of Systems Review of Systems :negative unless otherwise stated in HPI.      Physical Exam Triage Vital Signs ED Triage Vitals  Enc Vitals Group     BP 02/23/23 1231 (!) 145/96     Pulse Rate  02/23/23 1231 89     Resp 02/23/23 1231 16     Temp 02/23/23 1231 98.2 F (36.8 C)     Temp Source 02/23/23 1231 Oral     SpO2 02/23/23 1231 98 %     Weight 02/23/23 1231 189 lb (85.7 kg)     Height 02/23/23 1231 5\' 5"  (1.651 m)     Head Circumference --      Peak Flow --      Pain Score 02/23/23 1230 8     Pain Loc --      Pain Edu? --      Excl. in [place]? --    No data found.  Updated Vital Signs BP (!) 145/96 (BP Location: Left Arm)   Pulse  89   Temp 98.2 F (36.8 C) (Oral)   Resp 16   Ht 5\' 5"  (1.651 m)   Wt 85.7 kg   SpO2 98%   BMI 31.45 kg/m   Visual Acuity Right Eye Distance:   Left Eye Distance:   Bilateral Distance:    Right Eye Near:   Left Eye Near:    Bilateral Near:     Physical Exam  GEN: non-toxic appearing female, in no acute distress  CV: regular rate and rhythm, no murmurs appreciated  RESP: no increased work of breathing, clear to ascultation bilaterally ABD: Bowel sounds present. Soft, right upper quadrant and epigastric, non-distended.  No guarding, no rebound, no appreciable hepatosplenomegaly, no CVA tenderness, negative McBurney's, negative Murphy MSK: no extremity edema SKIN: warm, dry, no rash on visible skin NEURO: alert, moves all extremities appropriately PSYCH: Normal affect, appropriate speech and behavior   UC Treatments / Results  Labs (all labs ordered are listed, but only abnormal results are displayed) Labs Reviewed  COMPREHENSIVE METABOLIC PANEL - Abnormal; Notable for the following components:      Result Value   Glucose, Bld 108 (*)    Calcium 8.4 (*)    All other components within normal limits  CBC WITH DIFFERENTIAL/PLATELET  LIPASE, BLOOD    EKG   Radiology No results found.  Procedures Procedures (including critical care time)  Medications Ordered in UC Medications  alum & mag hydroxide-simeth (MAALOX/MYLANTA) 200-200-20 MG/5ML suspension 30 mL (30 mLs Oral Given 02/23/23 1319)  lidocaine (XYLOCAINE) 2 %  viscous mouth solution 15 mL (15 mLs Mouth/Throat Given 02/23/23 1319)    Initial Impression / Assessment and Plan / UC Course  I have reviewed the triage vital signs and the nursing notes.  Pertinent labs & imaging results that were available during my care of the patient were reviewed by me and considered in my medical decision making (see chart for details).        Patient is a  58 y.o. female with history gastric bypass who presents after having insidious severe abdominal pain with vomiting and diarrhea.  Overall, patient is ill but non-toxic-appearing, well-hydrated, and in no acute distress.  Vital signs stable.  Kelly [name] afebrile.  She has epigastric and RUQ abdominal pain.   Obtained CBC, CMP, and lipase that were grossly unremarkable. Given GI cocktail with some improvement. Discussed ED evaluation for CT imaging that is not available here however she declines. States she will go home and see if pain returns and if so will go to the ED.  This is reasonable. Strict ED precautions given.   Treat for food vs viral gastroenteritis with omeprazole, famotidine and gast-ex. Discussed MDM, treatment plan and plan for follow-up with patient who agrees with plan.    Final Clinical Impressions(s) / UC Diagnoses   Final diagnoses:  Epigastric pain     Discharge Instructions      Stop by the pharmacy to pick up your prescriptions.  Follow up with your primary care provider as needed.  Go to ED for red flag symptoms, including; fevers you cannot reduce with Tylenol/Motrin, severe headaches, vision changes, numbness/weakness in part of the body, lethargy, confusion, intractable vomiting, severe dehydration, chest pain, breathing difficulty, severe persistent abdominal or pelvic pain, signs of severe infection (increased redness, swelling of an area), feeling faint or passing out, dizziness, etc. You should especially go to the ED for sudden acute worsening of condition if you do not elect to  go at this time.  ED Prescriptions     Medication Sig Dispense Auth. Provider   famotidine (PEPCID) 20 MG tablet Take 1 tablet (20 mg total) by mouth 2 (two) times daily. 30 tablet Wilber Fini, DO   omeprazole (PRILOSEC) 40 MG capsule Take 1 capsule (40 mg total) by mouth daily. 30 capsule Zameria Vogl, DO   simethicone (GAS-X) 80 MG chewable tablet Chew 1 tablet (80 mg total) by mouth every 6 (six) hours as needed for flatulence. 30 tablet Lyndee Hensen, DO      PDMP not reviewed this encounter.   Lyndee Hensen, DO 02/27/23 [phone]

## 2023-02-23 NOTE — Discharge Instructions (Signed)
Stop by the pharmacy to pick up your prescriptions.  Follow up with your primary care provider as needed.  Go to ED for red flag symptoms, including; fevers you cannot reduce with Tylenol/Motrin, severe headaches, vision changes, numbness/weakness in part of the body, lethargy, confusion, intractable vomiting, severe dehydration, chest pain, breathing difficulty, severe persistent abdominal or pelvic pain, signs of severe infection (increased redness, swelling of an area), feeling faint or passing out, dizziness, etc. You should especially go to the ED for sudden acute worsening of condition if you do not elect to go at this time.

## 2023-02-23 NOTE — ED Triage Notes (Signed)
Pt via pov from home with RUQ abdominal pain x 2 days as well as n/vd since Sunday. Pt went to UC and was sent here for evaluation and CT scan. Pt alert & oriented, nad noted.

## 2023-02-23 NOTE — ED Triage Notes (Signed)
Pt c/o upper abd pain,N/V/D & bloating x1 day. [id number] AM, 4 episodes of emesis this AM, unable to keep anything down.

## 2023-03-13 ENCOUNTER — Other Ambulatory Visit: Payer: Self-pay | Admitting: Internal Medicine

## 2023-04-11 ENCOUNTER — Other Ambulatory Visit: Payer: Self-pay | Admitting: Internal Medicine

## 2023-04-17 ENCOUNTER — Ambulatory Visit: Payer: BC Managed Care – PPO | Admitting: Internal Medicine

## 2023-04-17 VITALS — BP 142/92 | HR 72 | Temp 98.0°F | Ht 65.0 in | Wt 196.8 lb

## 2023-04-17 DIAGNOSIS — R5383 Other fatigue: Secondary | ICD-10-CM

## 2023-04-17 DIAGNOSIS — R7301 Impaired fasting glucose: Secondary | ICD-10-CM

## 2023-04-17 DIAGNOSIS — Z9884 Bariatric surgery status: Secondary | ICD-10-CM

## 2023-04-17 DIAGNOSIS — R1011 Right upper quadrant pain: Secondary | ICD-10-CM

## 2023-04-17 DIAGNOSIS — E785 Hyperlipidemia, unspecified: Secondary | ICD-10-CM | POA: Diagnosis not present

## 2023-04-17 DIAGNOSIS — Z1231 Encounter for screening mammogram for malignant neoplasm of breast: Secondary | ICD-10-CM

## 2023-04-17 DIAGNOSIS — I10 Essential (primary) hypertension: Secondary | ICD-10-CM

## 2023-04-17 DIAGNOSIS — M5432 Sciatica, left side: Secondary | ICD-10-CM | POA: Diagnosis not present

## 2023-04-17 DIAGNOSIS — R635 Abnormal weight gain: Secondary | ICD-10-CM

## 2023-04-17 DIAGNOSIS — K581 Irritable bowel syndrome with constipation: Secondary | ICD-10-CM

## 2023-04-17 LAB — LIPID PANEL
Cholesterol: 198 mg/dL (ref 0–200)
HDL: 73.7 mg/dL (ref >39.00)
LDL Cholesterol: 107 mg/dL — ABNORMAL HIGH (ref 0–99)
NonHDL: 124.24
Total CHOL/HDL Ratio: 3
Triglycerides: 88 mg/dL (ref 0.0–149.0)
VLDL: 17.6 mg/dL (ref 0.0–40.0)

## 2023-04-17 LAB — HEMOGLOBIN A1C: Hgb A1c MFr Bld: 5.9 % (ref 4.6–6.5)

## 2023-04-17 LAB — LDL CHOLESTEROL, DIRECT: Direct LDL: 114 mg/dL

## 2023-04-17 LAB — TSH: TSH: 1.02 u[IU]/mL (ref 0.35–5.50)

## 2023-04-17 MED ORDER — GABAPENTIN 300 MG PO CAPS: 300.0000 mg | ORAL_CAPSULE | Freq: Two times a day (BID) | ORAL | 1 refills | Status: DC

## 2023-04-17 MED ORDER — LINZESS 145 MCG PO CAPS: 145.0000 ug | ORAL_CAPSULE | Freq: Every day | ORAL | 5 refills | Status: DC

## 2023-04-17 MED ORDER — VITAMIN D (ERGOCALCIFEROL) 1.25 MG (50000 UNIT) PO CAPS: ORAL_CAPSULE | ORAL | 0 refills | Status: DC

## 2023-04-17 MED ORDER — ESTRADIOL 0.0375 MG/24HR TD PTWK: MEDICATED_PATCH | TRANSDERMAL | 0 refills | Status: DC

## 2023-04-17 NOTE — Progress Notes (Unsigned)
Subjective:  Patient ID: Kelly [name], female    DOB: 03-10-1966  Age: 57 y.o. MRN: [id number]  CC: The primary encounter diagnosis was Essential hypertension. Diagnoses of Sciatica of left side, Impaired fasting glucose, Dyslipidemia, Other fatigue, Encounter for screening mammogram for malignant neoplasm of breast, Postprandial RUQ pain, Irritable bowel syndrome with constipation, and Weight gain following gastric bypass surgery were also pertinent to this visit.   HPI SYMIA HERDT presents for  Chief Complaint  Patient presents with   Medical Management of Chronic Issues    1) obesity :  she has had a weight gain since her  bariatric surgery in 2016 when she reached a  nadir 125.  Her goal Is  160 .  Feels like her appetite has not enlarged,  and she often eats only one meal daily due to her work schedule.  She is not exercising   2) RUQ pain : evaluated in ER in March for appendicitis pancreatitis and cholecystitis.  Reviewed ER workup.  Pain now 3/10 but recues postprandially   3) IBS constiopation: not taking Linzess regularly  4) HTN:  home readings have been elevated to  150/90     Outpatient Medications Prior to Visit  Medication Sig Dispense Refill   Multiple Vitamin (MULTIVITAMIN) capsule Take 1 capsule by mouth daily.     omeprazole (PRILOSEC) 40 MG capsule Take 1 capsule (40 mg total) by mouth daily. 30 capsule 0   ondansetron (ZOFRAN-ODT) 4 MG disintegrating tablet Take 1 tablet (4 mg total) by mouth every 8 (eight) hours as needed for nausea or vomiting. 20 tablet 0   simethicone (GAS-X) 80 MG chewable tablet Chew 1 tablet (80 mg total) by mouth every 6 (six) hours as needed for flatulence. 30 tablet 0   trimethoprim (TRIMPEX) 100 MG tablet Take 1 tablet (100 mg total) by mouth daily. 90 tablet 0   estradiol (CLIMARA - DOSED IN MG/24 HR) 0.0375 mg/24hr patch PLACE 1 PATCH (0.0375 MG TOTAL) ONTO THE SKIN ONCE A WEEK. 4 patch 0   gabapentin (NEURONTIN) 300  MG capsule Take 1 capsule (300 mg total) by mouth 2 (two) times daily. 90 capsule 1   LINZESS 145 MCG CAPS capsule Take 1 capsule (145 mcg total) by mouth daily. 30 capsule 5   Vitamin D, Ergocalciferol, (DRISDOL) 1.25 MG (50000 UNIT) CAPS capsule TAKE 1 CAPSULE (50,000 UNITS TOTAL) BY MOUTH TWO TIMES A WEEK 24 capsule 0   estradiol (ESTRACE) 0.1 MG/GM vaginal cream Estrogen Cream Instruction Discard applicator Apply pea sized amount to tip of finger to urethra before bed. Wash hands well after application. Use Monday, Wednesday and Friday 42.5 g 3   famotidine (PEPCID) 20 MG tablet Take 1 tablet (20 mg total) by mouth 2 (two) times daily. 30 tablet 0   No facility-administered medications prior to visit.    Review of Systems;  Patient denies headache, fevers, malaise, unintentional weight loss, skin rash, eye pain, sinus congestion and sinus pain, sore throat, dysphagia,  hemoptysis , cough, dyspnea, wheezing, chest pain, palpitations, orthopnea, edema,   nausea, melena, diarrhea, constipation, flank pain, dysuria, hematuria, urinary  Frequency, nocturia, numbness, tingling, seizures,  Focal weakness, Loss of consciousness,  Tremor, insomnia, depression, anxiety, and suicidal ideation.      Objective:  BP (!) 142/92   Pulse 72   Temp 98 F (36.7 C) (Oral)   Ht 5\' 5"  (1.651 m)   Wt 196 lb 12.8 oz (89.3 kg)   SpO2 98%  BMI 32.75 kg/m   BP Readings from Last 3 Encounters:  04/17/23 (!) 142/92  02/23/23 132/85  02/23/23 (!) 145/96    Wt Readings from Last 3 Encounters:  04/17/23 196 lb 12.8 oz (89.3 kg)  02/23/23 189 lb (85.7 kg)  02/23/23 189 lb (85.7 kg)    Physical Exam Vitals reviewed.  Constitutional:      General: She is not in acute distress.    Appearance: Normal appearance. She is normal weight. She is not ill-appearing, toxic-appearing or diaphoretic.  HENT:     Head: Normocephalic.  Eyes:     General: No scleral icterus.       Right eye: No discharge.         Left eye: No discharge.     Conjunctiva/sclera: Conjunctivae normal.  Cardiovascular:     Rate and Rhythm: Normal rate and regular rhythm.     Heart sounds: Normal heart sounds.  Pulmonary:     Effort: Pulmonary effort is normal. No respiratory distress.     Breath sounds: Normal breath sounds.  Musculoskeletal:        General: Normal range of motion.  Skin:    General: Skin is warm and dry.  Neurological:     General: No focal deficit present.     Mental Status: She is alert and oriented to person, place, and time. Mental status is at baseline.  Psychiatric:        Mood and Affect: Mood normal.        Behavior: Behavior normal.        Thought Content: Thought content normal.        Judgment: Judgment normal.    Lab Results  Component Value Date   HGBA1C 5.9 04/17/2023   HGBA1C 5.8 01/10/2022   HGBA1C 5.9 08/23/2021    Lab Results  Component Value Date   CREATININE 0.61 02/23/2023   CREATININE 0.61 01/10/2022   CREATININE 0.61 01/10/2022    Lab Results  Component Value Date   WBC 8.4 02/23/2023   HGB 12.8 02/23/2023   HCT 39.3 02/23/2023   PLT 237 02/23/2023   GLUCOSE 108 (H) 02/23/2023   CHOL 198 04/17/2023   TRIG 88.0 04/17/2023   HDL 73.70 04/17/2023   LDLDIRECT 114.0 04/17/2023   LDLCALC 107 (H) 04/17/2023   ALT 16 02/23/2023   AST 19 02/23/2023   NA 136 02/23/2023   K 3.8 02/23/2023   CL 103 02/23/2023   CREATININE 0.61 02/23/2023   BUN 12 02/23/2023   CO2 24 02/23/2023   TSH 1.02 04/17/2023   HGBA1C 5.9 04/17/2023   MICROALBUR 1.0 05/10/2020    CT ABDOMEN PELVIS W CONTRAST  Result Date: 02/23/2023 CLINICAL DATA:  Right abdominal pain with nausea vomiting and diarrhea. EXAM: CT ABDOMEN AND PELVIS WITH CONTRAST TECHNIQUE: Multidetector CT imaging of the abdomen and pelvis was performed using the standard protocol following bolus administration of intravenous contrast. RADIATION DOSE REDUCTION: This exam was performed according to the departmental  dose-optimization program which includes automated exposure control, adjustment of the mA and/or kV according to patient size and/or use of iterative reconstruction technique. CONTRAST:  80mL OMNIPAQUE IOHEXOL 350 MG/ML SOLN COMPARISON:  Hepatobiliary ultrasound of 02/23/2023 and renal ultrasound of 11/11/2022 in addition to CT scan from 10/04/2016 FINDINGS: Lower chest: Small type 1 hiatal hernia. Hepatobiliary: Unremarkable Pancreas: Unremarkable Spleen: Unremarkable Adrenals/Urinary Tract: Unremarkable Stomach/Bowel: Gastric bypass with patent gastrojejunostomy. Normal appendix. There is formed stool in the distal colon. No dilated bowel. Vascular/Lymphatic: Unremarkable Reproductive:  Uterus absent.  Adnexa unremarkable. Other: No supplemental non-categorized findings. Musculoskeletal: 3 mm grade 1 degenerative anterolisthesis at L4-5. Lumbar spondylosis and degenerative disc disease cause substantial bilateral foraminal impingement at L5-S1 along with moderate left and mild right foraminal impingement at L4-5. IMPRESSION: 1. A specific cause for the patient's right abdominal pain is not identified. The appendix appears normal. 2. Small type 1 hiatal hernia. 3. Gastric bypass with patent gastrojejunostomy. 4. Lumbar spondylosis and degenerative disc disease causing impingement at L4-5 and L5-S1. Electronically Signed   By: Gaylyn Rong M.D.   On: 02/23/2023 20:15   US Abdomen Limited RUQ (LIVER/GB)  Result Date: 02/23/2023 CLINICAL DATA:  Right upper quadrant pain, nausea, vomiting EXAM: ULTRASOUND ABDOMEN LIMITED RIGHT UPPER QUADRANT COMPARISON:  None Available. FINDINGS: Gallbladder: No gallstones or wall thickening visualized. No sonographic Murphy sign noted by sonographer. Common bile duct: Diameter: Normal caliber, 3 mm Liver: No focal lesion identified. Within normal limits in parenchymal echogenicity. Portal vein is patent on color Doppler imaging with normal direction of blood flow towards the  liver. Other: None. IMPRESSION: Unremarkable study. Electronically Signed   By: Charlett Nose M.D.   On: 02/23/2023 19:20    Assessment & Plan:  .Essential hypertension Assessment & Plan: Resolved with weight loss post bariatric surgery,  but has now recurred ,  confirmed with home readings.  Starting amlodipine .  She will return  for check on proteinuria/  Lab Results  Component Value Date   LABMICR See below: 10/22/2022   MICROALBUR 1.0 05/10/2020   MICROALBUR <0.7 06/14/2015       Orders: -     Microalbumin / creatinine urine ratio; Future -     amLODIPine Besylate; Take 1 tablet (2.5 mg total) by mouth daily.  Dispense: 90 tablet; Refill: 1  Sciatica of left side -     Gabapentin; Take 1 capsule (300 mg total) by mouth 2 (two) times daily.  Dispense: 90 capsule; Refill: 1  Impaired fasting glucose -     Hemoglobin A1c -     Microalbumin / creatinine urine ratio; Future  Dyslipidemia -     Lipid panel -     LDL cholesterol, direct  Other fatigue -     TSH  Encounter for screening mammogram for malignant neoplasm of breast -     3D Screening Mammogram, Left and Right; Future  Postprandial RUQ pain Assessment & Plan: CT and U/S were nondiagnostic in  March .  Pain still occurring .  HIDA scan ordered   Orders: -     NM Hepato W/EF; Future  Irritable bowel syndrome with constipation Assessment & Plan: Constipation predominant.  Resume linzess,  add citrucel   Weight gain following gastric bypass surgery Assessment & Plan: She has gained > 30 lbs   since reaching a nadir post bypass.  She is not exercising .  Diet reviewed, lifestyle reviewed.   Cousnelling given.  Normal TSH  Lab Results  Component Value Date   TSH 1.02 04/17/2023      Other orders -     Estradiol; PLACE 1 PATCH (0.0375 MG TOTAL) ONTO THE SKIN ONCE A WEEK.  Dispense: 4 patch; Refill: 0 -     Linzess; Take 1 capsule (145 mcg total) by mouth daily.  Dispense: 30 capsule; Refill: 5 -      Vitamin D (Ergocalciferol); TAKE 1 CAPSULE (50,000 UNITS TOTAL) BY MOUTH TWO TIMES A WEEK  Dispense: 24 capsule; Refill: 0     I  provided 30 minutes of face-to-face time during this encounter reviewing patient's last visit with me, patient's  most recent visit with cardiology,  nephrology,  and neurology,  recent surgical and non surgical procedures, previous  labs and imaging studies, counseling on currently addressed issues,  and post visit ordering to diagnostics and therapeutics .   Follow-up: Return in about 3 months (around 07/17/2023).   [name], MD

## 2023-04-17 NOTE — Assessment & Plan Note (Signed)
Resolved with weight loss post bariatric surgery,  but has now recurredn ,  confirmed with home readings.  Checking urine for protein before deciding on medication.

## 2023-04-17 NOTE — Assessment & Plan Note (Addendum)
She has gained > 30 lbs   since reaching a nadir post bypass.  She is not exercising .  Diet reviewed, lifestyle reviewed.   Cousnelling given.  Normal TSH  Lab Results  Component Value Date   TSH 1.02 04/17/2023

## 2023-04-17 NOTE — Assessment & Plan Note (Signed)
Constipation predominant.  Resume linzess,  add citrucel

## 2023-04-17 NOTE — Assessment & Plan Note (Signed)
CT and U/S were nondiagnostic in  March .  Pain still occurring .  HIDA scan ordered

## 2023-04-17 NOTE — Patient Instructions (Addendum)
1) for the weight gain:  A )[employer] "low carb power bowl"  entrees and  "Steamer" entrees are are great low carb entrees that microwave in 5 minutes      B)Overeater's Anonymous group in [place]  see if they have online meetings    C)Exercise  !    2) for the constipation:  resume Linzess.  If constipation persists,  try adding citrucel daily to a glass of water  3)  for the right upper quadrant and right sided back pain:  I hve ordered a HIDA scan to make sure the gallbladder is emptying properly    4) HTN:  we will start a BP medication once I see the results of today's labs         Your annual mammogram has been ordered   [place] will not allow [place] to schedule it for you,  so please  call to make your appointment [phone]

## 2023-04-19 MED ORDER — AMLODIPINE BESYLATE 2.5 MG PO TABS: 2.5000 mg | ORAL_TABLET | Freq: Every day | ORAL | 1 refills | Status: DC

## 2023-04-20 LAB — MICROALBUMIN / CREATININE URINE RATIO
Creatinine,U: 123.7 mg/dL
Microalb Creat Ratio: 0.6 mg/g (ref 0.0–30.0)
Microalb, Ur: <0.7 mg/dL (ref 0.0–1.9)

## 2023-05-04 ENCOUNTER — Encounter
Admission: RE | Admit: 2023-05-04 | Discharge: 2023-05-04 | Disposition: A | Discharge status: Home / Self Care | Payer: BC Managed Care – PPO | Source: Ambulatory Visit | Attending: Internal Medicine | Admitting: Internal Medicine

## 2023-05-04 DIAGNOSIS — R1011 Right upper quadrant pain: Secondary | ICD-10-CM | POA: Diagnosis present

## 2023-05-04 MED ORDER — TECHNETIUM TC 99M MEBROFENIN IV KIT
5.0000 | PACK | Freq: Once | INTRAVENOUS | Status: AC | PRN
  Administered 2023-05-04: 5.33 via INTRAVENOUS

## 2023-06-05 ENCOUNTER — Other Ambulatory Visit: Payer: Self-pay | Admitting: Internal Medicine

## 2023-07-03 ENCOUNTER — Other Ambulatory Visit: Payer: Self-pay | Admitting: Internal Medicine

## 2023-07-17 ENCOUNTER — Encounter: Payer: Self-pay | Admitting: Internal Medicine

## 2023-07-17 ENCOUNTER — Ambulatory Visit: Payer: BC Managed Care – PPO | Admitting: Internal Medicine

## 2023-07-17 DIAGNOSIS — I10 Essential (primary) hypertension: Secondary | ICD-10-CM | POA: Diagnosis not present

## 2023-07-17 DIAGNOSIS — R600 Localized edema: Secondary | ICD-10-CM

## 2023-07-17 DIAGNOSIS — M5416 Radiculopathy, lumbar region: Secondary | ICD-10-CM

## 2023-07-17 DIAGNOSIS — M5432 Sciatica, left side: Secondary | ICD-10-CM | POA: Diagnosis not present

## 2023-07-17 LAB — MICROALBUMIN / CREATININE URINE RATIO
Creatinine,U: 143.2 mg/dL
Microalb Creat Ratio: 0.7 mg/g (ref 0.0–30.0)
Microalb, Ur: 0.9 mg/dL (ref 0.0–1.9)

## 2023-07-17 LAB — BASIC METABOLIC PANEL
BUN: 14 mg/dL (ref 6–23)
CO2: 30 mEq/L (ref 19–32)
Calcium: 9.7 mg/dL (ref 8.4–10.5)
Chloride: 103 mEq/L (ref 96–112)
Creatinine, Ser: 0.58 mg/dL (ref 0.40–1.20)
GFR: 100.76 mL/min (ref >60.00)
Glucose, Bld: 115 mg/dL — ABNORMAL HIGH (ref 70–99)
Potassium: 3.9 mEq/L (ref 3.5–5.1)
Sodium: 140 mEq/L (ref 135–145)

## 2023-07-17 MED ORDER — TRAMADOL HCL 50 MG PO TABS: 50.0000 mg | ORAL_TABLET | Freq: Four times a day (QID) | ORAL | 0 refills | Status: AC | PRN

## 2023-07-17 MED ORDER — LOSARTAN POTASSIUM-HCTZ 50-12.5 MG PO TABS: 1.0000 | ORAL_TABLET | Freq: Every day | ORAL | 1 refills | Status: DC

## 2023-07-17 MED ORDER — GABAPENTIN 300 MG PO CAPS: 300.0000 mg | ORAL_CAPSULE | Freq: Two times a day (BID) | ORAL | 1 refills | Status: DC

## 2023-07-17 NOTE — Progress Notes (Unsigned)
Subjective:  Patient ID: Kelly [name], female    DOB: October 08, 1966  Age: 57 y.o. MRN: [id number]  CC: The primary encounter diagnosis was Fluid retention in legs. Diagnoses of Essential hypertension, Sciatica of left side, and Right lumbar radiculitis were also pertinent to this visit.   HPI Kelly [name] presents for  Chief Complaint  Patient presents with   Medical Management of Chronic Issues    3 month follow up    1) hypertension:  patient checks blood pressure once  weekly at home.  Readings have been for the most part 1340/90 at rest . Patient is following a reduced salt diet most days and is taking medications as prescribed (taking amlodipine 2.5 mg). She has been experiencing edema in both feet and ankles  2) morbid obesity:  s/p bariatric surgery in 2016 , weight preoperatively 244 ,  reached  nadir weight of   144 in 2018  and regain of  60 lbs over the past 8 yrs,  6 lbs since late April   3) R sided Sciatica,  secondary to lumbar spondylarthropathy,   reached a  severity in May/June had to use a walker.  MRI was done by Orthopedics July 8  .  Her  pain is persistent despite  chiropractic manipulations and physical therapy/ exercises.  Had an ESI yesterday,  no relief yet. Plans for one more by Sadie Haber at [facility] who has rec TLIF of L4-S1 .   taking gabapentin    Outpatient Medications Prior to Visit  Medication Sig Dispense Refill   amLODipine (NORVASC) 2.5 MG tablet Take 1 tablet (2.5 mg total) by mouth daily. 90 tablet 1   estradiol (CLIMARA - DOSED IN MG/24 HR) 0.0375 mg/24hr patch PLACE 1 PATCH (0.0375 MG TOTAL) ONTO THE SKIN ONCE A WEEK. 4 patch 2   LINZESS 145 MCG CAPS capsule Take 1 capsule (145 mcg total) by mouth daily. 30 capsule 5   Multiple Vitamin (MULTIVITAMIN) capsule Take 1 capsule by mouth daily.     ondansetron (ZOFRAN-ODT) 4 MG disintegrating tablet Take 1 tablet (4 mg total) by mouth every 8 (eight) hours as needed for nausea or  vomiting. 20 tablet 0   simethicone (GAS-X) 80 MG chewable tablet Chew 1 tablet (80 mg total) by mouth every 6 (six) hours as needed for flatulence. 30 tablet 0   Vitamin D, Ergocalciferol, (DRISDOL) 1.25 MG (50000 UNIT) CAPS capsule TAKE 1 CAPSULE (50,000 UNITS TOTAL) BY MOUTH TWO TIMES A WEEK 24 capsule 0   gabapentin (NEURONTIN) 300 MG capsule Take 1 capsule (300 mg total) by mouth 2 (two) times daily. 90 capsule 1   omeprazole (PRILOSEC) 40 MG capsule Take 1 capsule (40 mg total) by mouth daily. 30 capsule 0   trimethoprim (TRIMPEX) 100 MG tablet Take 1 tablet (100 mg total) by mouth daily. 90 tablet 0   No facility-administered medications prior to visit.    Review of Systems;  Patient denies headache, fevers, malaise, unintentional weight loss, skin rash, eye pain, sinus congestion and sinus pain, sore throat, dysphagia,  hemoptysis , cough, dyspnea, wheezing, chest pain, palpitations, orthopnea, abdominal pain, nausea, melena, diarrhea, constipation, flank pain, dysuria, hematuria, urinary  Frequency, nocturia, numbness, tingling, seizures,  Focal weakness, Loss of consciousness,  Tremor, insomnia, depression, anxiety, and suicidal ideation.      Objective:  BP (!) 160/96   Pulse 76   Temp 98.5 F (36.9 C) (Oral)   Ht 5\' 5"  (1.651 m)   Wt  203 lb 12.8 oz (92.4 kg)   SpO2 97%   BMI 33.91 kg/m   BP Readings from Last 3 Encounters:  07/17/23 (!) 160/96  04/17/23 (!) 142/92  02/23/23 132/85    Wt Readings from Last 3 Encounters:  07/17/23 203 lb 12.8 oz (92.4 kg)  04/17/23 196 lb 12.8 oz (89.3 kg)  02/23/23 189 lb (85.7 kg)    Physical Exam Vitals reviewed.  Constitutional:      General: She is not in acute distress.    Appearance: Normal appearance. She is normal weight. She is not ill-appearing, toxic-appearing or diaphoretic.  HENT:     Head: Normocephalic.  Eyes:     General: No scleral icterus.       Right eye: No discharge.        Left eye: No discharge.      Conjunctiva/sclera: Conjunctivae normal.  Cardiovascular:     Rate and Rhythm: Normal rate and regular rhythm.     Heart sounds: Normal heart sounds.  Pulmonary:     Effort: Pulmonary effort is normal. No respiratory distress.     Breath sounds: Normal breath sounds.  Musculoskeletal:        General: Normal range of motion.     Right lower leg: Edema present.     Left lower leg: Edema present.  Skin:    General: Skin is warm and dry.  Neurological:     General: No focal deficit present.     Mental Status: She is alert and oriented to person, place, and time. Mental status is at baseline.  Psychiatric:        Mood and Affect: Mood normal.        Behavior: Behavior normal.        Thought Content: Thought content normal.        Judgment: Judgment normal.     Lab Results  Component Value Date   HGBA1C 5.9 04/17/2023   HGBA1C 5.8 01/10/2022   HGBA1C 5.9 08/23/2021    Lab Results  Component Value Date   CREATININE 0.58 07/17/2023   CREATININE 0.61 02/23/2023   CREATININE 0.61 01/10/2022   CREATININE 0.61 01/10/2022    Lab Results  Component Value Date   WBC 8.4 02/23/2023   HGB 12.8 02/23/2023   HCT 39.3 02/23/2023   PLT 237 02/23/2023   GLUCOSE 115 (H) 07/17/2023   CHOL 198 04/17/2023   TRIG 88.0 04/17/2023   HDL 73.70 04/17/2023   LDLDIRECT 114.0 04/17/2023   LDLCALC 107 (H) 04/17/2023   ALT 16 02/23/2023   AST 19 02/23/2023   NA 140 07/17/2023   K 3.9 07/17/2023   CL 103 07/17/2023   CREATININE 0.58 07/17/2023   BUN 14 07/17/2023   CO2 30 07/17/2023   TSH 1.02 04/17/2023   HGBA1C 5.9 04/17/2023   MICROALBUR 0.9 07/17/2023    NM Hepato W/Eject Fract  Result Date: 05/04/2023 CLINICAL DATA:  Right upper quadrant abdominal pain, biliary disease suspected. EXAM: NUCLEAR MEDICINE HEPATOBILIARY IMAGING WITH GALLBLADDER EF TECHNIQUE: Sequential images of the abdomen were obtained out to 60 minutes following intravenous administration of radiopharmaceutical.  After oral ingestion of Ensure, gallbladder ejection fraction was determined. At 60 min, normal ejection fraction is greater than 33%. RADIOPHARMACEUTICALS:  5.33 mCi Tc-81m  Choletec IV COMPARISON:  CT and ultrasound February 23, 2023 FINDINGS: Prompt uptake and biliary excretion of activity by the liver is seen. Gallbladder activity is visualized, consistent with patency of cystic duct. Biliary activity passes into small bowel,  consistent with patent common bile duct. Calculated gallbladder ejection fraction is 84%. (Normal gallbladder ejection fraction with Ensure is greater than 33%.) IMPRESSION: 1.  Patent cystic and common bile ducts. 2.  Normal gallbladder ejection fraction. Electronically Signed   By: Maudry Mayhew M.D.   On: 05/04/2023 14:40    Assessment & Plan:  .Fluid retention in legs Assessment & Plan: Venous insufficiency  aggravate by durg induced edema from use of gabapentin and amlodipine .  Hydrochlorothiazide prescribed    Essential hypertension Assessment & Plan: Not at goal on low dose amldodipine, and having edema at current dose.  Changing therapy to  losartan/hct.  rtc one week    Lab Results  Component Value Date   LABMICR See below: 10/22/2022   MICROALBUR 0.9 07/17/2023   MICROALBUR <0.7 04/20/2023     Lab Results  Component Value Date   NA 140 07/17/2023   K 3.9 07/17/2023   CL 103 07/17/2023   CO2 30 07/17/2023   Lab Results  Component Value Date   CREATININE 0.58 07/17/2023     Orders: -     Basic metabolic panel -     Microalbumin / creatinine urine ratio -     Basic metabolic panel; Future  Sciatica of left side -     Gabapentin; Take 1 capsule (300 mg total) by mouth 2 (two) times daily.  Dispense: 90 capsule; Refill: 1  Right lumbar radiculitis Assessment & Plan: Secondary to L5-S Spondylarthropathy.  Will likely need surgery given lack of improvement  with ESI.  addingg tramadol for pain management    Other orders -     Losartan  Potassium-HCTZ; Take 1 tablet by mouth daily.  Dispense: 90 tablet; Refill: 1 -     traMADol HCl; Take 1 tablet (50 mg total) by mouth every 6 (six) hours as needed for up to 7 days.  Dispense: 28 tablet; Refill: 0     I provided 30 minutes of face-to-face time during this encounter reviewing patient's last visit with me, patient's  most recent visit with orthopedics. ,  recent surgical and non surgical procedures, previous  labs and imaging studies, counseling on currently addressed issues,  and post visit ordering to diagnostics and therapeutics .   Follow-up: Return in about 3 months (around 10/17/2023) for chronic pain management, hypertension.   [name], MD

## 2023-07-17 NOTE — Patient Instructions (Addendum)
Stop the amlodipine.  It is aggravating your fluid retention   Start losartan hct in the morning.  This has a mild diuretic in it.  You may Increase the dose to 2 tablets in the mornin (after 1 week)   if  your home readings have not responded to goal Blood pressure  of 130/80 or less  Return for BMET after 1 week    I have prescribed tramadol to use for additional pain management.  I can only give you the maximal amount of 28 tablets for the first prescription.  Do NOT use as directed on the bottle.  USE SPARINGLY .  If you need a refill you will need to supply information about how often you need to take it.

## 2023-07-19 NOTE — Assessment & Plan Note (Signed)
Not at goal on low dose amldodipine, and having edema at current dose.  Changing therapy to  losartan/hct.  rtc one week    Lab Results  Component Value Date   LABMICR See below: 10/22/2022   MICROALBUR 0.9 07/17/2023   MICROALBUR <0.7 04/20/2023     Lab Results  Component Value Date   NA 140 07/17/2023   K 3.9 07/17/2023   CL 103 07/17/2023   CO2 30 07/17/2023   Lab Results  Component Value Date   CREATININE 0.58 07/17/2023

## 2023-07-19 NOTE — Assessment & Plan Note (Addendum)
Secondary to L5-S Spondylarthropathy.  Will likely need surgery given lack of improvement  with ESI.  addingg tramadol for pain management

## 2023-07-19 NOTE — Assessment & Plan Note (Signed)
Venous insufficiency  aggravate by durg induced edema from use of gabapentin and amlodipine .  Hydrochlorothiazide prescribed

## 2023-07-24 ENCOUNTER — Other Ambulatory Visit: Payer: BC Managed Care – PPO

## 2023-10-19 ENCOUNTER — Ambulatory Visit: Payer: BC Managed Care – PPO | Admitting: Internal Medicine

## 2023-10-19 ENCOUNTER — Ambulatory Visit: Payer: BC Managed Care – PPO

## 2023-10-19 VITALS — BP 122/80 | HR 88 | Ht 65.0 in | Wt 203.2 lb

## 2023-10-19 DIAGNOSIS — M5416 Radiculopathy, lumbar region: Secondary | ICD-10-CM | POA: Diagnosis not present

## 2023-10-19 DIAGNOSIS — R059 Cough, unspecified: Secondary | ICD-10-CM | POA: Insufficient documentation

## 2023-10-19 DIAGNOSIS — I10 Essential (primary) hypertension: Secondary | ICD-10-CM | POA: Diagnosis not present

## 2023-10-19 DIAGNOSIS — M5432 Sciatica, left side: Secondary | ICD-10-CM

## 2023-10-19 DIAGNOSIS — D649 Anemia, unspecified: Secondary | ICD-10-CM

## 2023-10-19 LAB — CBC WITH DIFFERENTIAL/PLATELET
Basophils Absolute: 0 10*3/uL (ref 0.0–0.1)
Basophils Relative: 0.6 % (ref 0.0–3.0)
Eosinophils Absolute: 0.1 10*3/uL (ref 0.0–0.7)
Eosinophils Relative: 1.3 % (ref 0.0–5.0)
HCT: 37.4 % (ref 36.0–46.0)
Hemoglobin: 11.7 g/dL — ABNORMAL LOW (ref 12.0–15.0)
Lymphocytes Relative: 38.2 % (ref 12.0–46.0)
Lymphs Abs: 2.1 10*3/uL (ref 0.7–4.0)
MCHC: 31.3 g/dL (ref 30.0–36.0)
MCV: 85.2 fL (ref 78.0–100.0)
Monocytes Absolute: 0.5 10*3/uL (ref 0.1–1.0)
Monocytes Relative: 8.5 % (ref 3.0–12.0)
Neutro Abs: 2.8 10*3/uL (ref 1.4–7.7)
Neutrophils Relative %: 51.4 % (ref 43.0–77.0)
Platelets: 210 10*3/uL (ref 150.0–400.0)
RBC: 4.39 Mil/uL (ref 3.87–5.11)
RDW: 14.9 % (ref 11.5–15.5)
WBC: 5.4 10*3/uL (ref 4.0–10.5)

## 2023-10-19 LAB — BASIC METABOLIC PANEL
BUN: 11 mg/dL (ref 6–23)
CO2: 29 meq/L (ref 19–32)
Calcium: 8.8 mg/dL (ref 8.4–10.5)
Chloride: 103 meq/L (ref 96–112)
Creatinine, Ser: 0.62 mg/dL (ref 0.40–1.20)
GFR: 98.98 mL/min (ref >60.00)
Glucose, Bld: 106 mg/dL — ABNORMAL HIGH (ref 70–99)
Potassium: 3.9 meq/L (ref 3.5–5.1)
Sodium: 138 meq/L (ref 135–145)

## 2023-10-19 NOTE — Patient Instructions (Addendum)
COUGH CAN BE DUE TO MANY CAUSES , NOT JUST ASTHMA OR PNEUMONIA .  WE WILL GET A CHEST  X RAY TODAY  1) ALLERGIES CAN CAUSE COUGH.  SO RESUME A DAILY ALLEGRA DOSE IN THE EVENING   2) GERD (REFLUX)  is an extremely common cause of rcough  , many times with no obvious heartburn at all. SO IF THE DAILY ALLEGRA DOESN'T HELP,  I SUGGEST WE ADD A MEDICATION   It can be treated with medication, but also with lifestyle changes including elevation of the head of your bed (ideally with 6 inch  bed blocks),  Smoking cessation, avoidance of late meals, excessive alcohol, and avoid fatty foods, chocolate, peppermint, colas, red wine, and acidic juices such as orange juice.   NO MINT OR MENTHOL PRODUCTS SO NO COUGH DROPS  USE SUGARLESS CANDY INSTEAD (Jolley ranchers or Stover's or Life Savers) or even ice chips will also do - the key is to swallow to prevent all throat clearing. NO OIL BASED VITAMINS - use powdered substitutes.    FOR THE BACK PAIN:   DON'T LET IT MAKE YOU SEDENTARY!  USE THE TRAMADOL IF NEEDED AFTER A WALK AND  TRY TAKING 1000 MG TYLENOL BEFORE A DAILY  WALK

## 2023-10-19 NOTE — Assessment & Plan Note (Addendum)
Repeat MRI was done by Orthopedics June 29 2023  .   Had an ESI  prior to last visit in July,   Plans for one more by Sadie Haber at [facility] who has rec TLIF of L4-S1 .  Her pain has been necessarily Managed without NSAIDS due to h/o bariatric surgery.  SHE WAS PRESCRIBED an initial trial of of  tramadol  AND  gabapentin was refilled  in July.  Last refill July 26

## 2023-10-19 NOTE — Progress Notes (Signed)
Subjective:  Patient ID: Kelly [name], female    DOB: 1965-12-23  Age: 57 y.o. MRN: [id number]  CC: The primary encounter diagnosis was Sciatica of left side. Diagnoses of Cough in adult, Essential hypertension, and Right lumbar radiculitis were also pertinent to this visit.   HPI Kelly [name] presents for  Chief Complaint  Patient presents with   Medical Management of Chronic Issues    3 month follow up on chronic pain and hypertension    1) back pain :  constant but improved with prn use of tramadol.  Limits use though,  and has become sedentary due to avoidance of pain.   2) morning productive cough becomes non productive and less frequent by mid day .  Does not cough during the night .  Lifelong no smoker .   Has been occurring for 2 months.   Outpatient Medications Prior to Visit  Medication Sig Dispense Refill   estradiol (CLIMARA - DOSED IN MG/24 HR) 0.0375 mg/24hr patch PLACE 1 PATCH (0.0375 MG TOTAL) ONTO THE SKIN ONCE A WEEK. 4 patch 2   gabapentin (NEURONTIN) 300 MG capsule Take 1 capsule (300 mg total) by mouth 2 (two) times daily. 90 capsule 1   LINZESS 145 MCG CAPS capsule Take 1 capsule (145 mcg total) by mouth daily. 30 capsule 5   losartan-hydrochlorothiazide (HYZAAR) 50-12.5 MG tablet Take 1 tablet by mouth daily. 90 tablet 1   Multiple Vitamin (MULTIVITAMIN) capsule Take 1 capsule by mouth daily.     ondansetron (ZOFRAN-ODT) 4 MG disintegrating tablet Take 1 tablet (4 mg total) by mouth every 8 (eight) hours as needed for nausea or vomiting. 20 tablet 0   simethicone (GAS-X) 80 MG chewable tablet Chew 1 tablet (80 mg total) by mouth every 6 (six) hours as needed for flatulence. 30 tablet 0   Vitamin D, Ergocalciferol, (DRISDOL) 1.25 MG (50000 UNIT) CAPS capsule TAKE 1 CAPSULE (50,000 UNITS TOTAL) BY MOUTH TWO TIMES A WEEK 24 capsule 0   amLODipine (NORVASC) 2.5 MG tablet Take 1 tablet (2.5 mg total) by mouth daily. 90 tablet 1   No  facility-administered medications prior to visit.    Review of Systems;  Patient denies headache, fevers, malaise, unintentional weight loss, skin rash, eye pain, sinus congestion and sinus pain, sore throat, dysphagia,  hemoptysis, dyspnea, wheezing, chest pain, palpitations, orthopnea, edema, abdominal pain, nausea, melena, diarrhea, constipation, flank pain, dysuria, hematuria, urinary  Frequency, nocturia, numbness, tingling, seizures,  Focal weakness, Loss of consciousness,  Tremor, insomnia, depression, anxiety, and suicidal ideation.      Objective:  BP 122/80   Pulse 88   Ht 5\' 5"  (1.651 m)   Wt 203 lb 3.2 oz (92.2 kg)   SpO2 97%   BMI 33.81 kg/m   BP Readings from Last 3 Encounters:  10/19/23 122/80  07/17/23 (!) 160/96  04/17/23 (!) 142/92    Wt Readings from Last 3 Encounters:  10/19/23 203 lb 3.2 oz (92.2 kg)  07/17/23 203 lb 12.8 oz (92.4 kg)  04/17/23 196 lb 12.8 oz (89.3 kg)    Physical Exam Vitals reviewed.  Constitutional:      General: She is not in acute distress.    Appearance: Normal appearance. She is normal weight. She is not ill-appearing, toxic-appearing or diaphoretic.  HENT:     Head: Normocephalic.  Eyes:     General: No scleral icterus.       Right eye: No discharge.  Left eye: No discharge.     Conjunctiva/sclera: Conjunctivae normal.  Cardiovascular:     Rate and Rhythm: Normal rate and regular rhythm.     Heart sounds: Normal heart sounds.  Pulmonary:     Effort: Pulmonary effort is normal. No respiratory distress.     Breath sounds: Normal breath sounds.  Musculoskeletal:        General: Normal range of motion.  Skin:    General: Skin is warm and dry.     Findings: Lesion present.  Neurological:     General: No focal deficit present.     Mental Status: She is alert and oriented to person, place, and time. Mental status is at baseline.  Psychiatric:        Mood and Affect: Mood normal.        Behavior: Behavior normal.         Thought Content: Thought content normal.        Judgment: Judgment normal.    Lab Results  Component Value Date   HGBA1C 5.9 04/17/2023   HGBA1C 5.8 01/10/2022   HGBA1C 5.9 08/23/2021    Lab Results  Component Value Date   CREATININE 0.62 10/19/2023   CREATININE 0.58 07/17/2023   CREATININE 0.61 02/23/2023    Lab Results  Component Value Date   WBC 5.4 10/19/2023   HGB 11.7 (L) 10/19/2023   HCT 37.4 10/19/2023   PLT 210.0 10/19/2023   GLUCOSE 106 (H) 10/19/2023   CHOL 198 04/17/2023   TRIG 88.0 04/17/2023   HDL 73.70 04/17/2023   LDLDIRECT 114.0 04/17/2023   LDLCALC 107 (H) 04/17/2023   ALT 16 02/23/2023   AST 19 02/23/2023   NA 138 10/19/2023   K 3.9 10/19/2023   CL 103 10/19/2023   CREATININE 0.62 10/19/2023   BUN 11 10/19/2023   CO2 29 10/19/2023   TSH 1.02 04/17/2023   HGBA1C 5.9 04/17/2023   MICROALBUR 0.9 07/17/2023    NM Hepato W/Eject Fract  Result Date: 05/04/2023 CLINICAL DATA:  Right upper quadrant abdominal pain, biliary disease suspected. EXAM: NUCLEAR MEDICINE HEPATOBILIARY IMAGING WITH GALLBLADDER EF TECHNIQUE: Sequential images of the abdomen were obtained out to 60 minutes following intravenous administration of radiopharmaceutical. After oral ingestion of Ensure, gallbladder ejection fraction was determined. At 60 min, normal ejection fraction is greater than 33%. RADIOPHARMACEUTICALS:  5.33 mCi Tc-76m  Choletec IV COMPARISON:  CT and ultrasound February 23, 2023 FINDINGS: Prompt uptake and biliary excretion of activity by the liver is seen. Gallbladder activity is visualized, consistent with patency of cystic duct. Biliary activity passes into small bowel, consistent with patent common bile duct. Calculated gallbladder ejection fraction is 84%. (Normal gallbladder ejection fraction with Ensure is greater than 33%.) IMPRESSION: 1.  Patent cystic and common bile ducts. 2.  Normal gallbladder ejection fraction. Electronically Signed   By: Maudry Mayhew  M.D.   On: 05/04/2023 14:40    Assessment & Plan:  .Sciatica of left side Assessment & Plan: Repeat MRI was done by Orthopedics June 29 2023  .   Had an ESI  prior to last visit in July,   Plans for one more by Sadie Haber at [facility] who has rec TLIF of L4-S1 .  Her pain has been necessarily Managed without NSAIDS due to h/o bariatric surgery.  SHE WAS PRESCRIBED an initial trial of of  tramadol  AND  gabapentin was refilled  in July.  Last refill July 26    Cough in adult Assessment &  Plan: Chest x ray ordered.  Advised to resume PPI for GERD and antihistamine    Orders: -     DG Chest 2 View; Future -     CBC with Differential/Platelet  Essential hypertension -     Basic metabolic panel  Right lumbar radiculitis Assessment & Plan: Secondary to L5-S Spondylarthropathy.  Will likely need surgery given lack of improvement  with ESI.  Continue prn use of tramadol for pain management       I provided 30 minutes of face-to-face time during this encounter reviewing patient's last visit with me, patient's  most recent visit with cardiology,  nephrology,  and neurology,  recent surgical and non surgical procedures, previous  labs and imaging studies, counseling on currently addressed issues,  and post visit ordering to diagnostics and therapeutics .   Follow-up: Return in about 4 weeks (around 11/16/2023) for FOLLOW UP ON COUGH , chronic pain management.   [name], MD

## 2023-10-19 NOTE — Assessment & Plan Note (Signed)
Chest x ray ordered.  Advised to resume PPI for GERD and antihistamine

## 2023-10-19 NOTE — Assessment & Plan Note (Signed)
Secondary to L5-S Spondylarthropathy.  Will likely need surgery given lack of improvement  with ESI.  Continue prn use of tramadol for pain management

## 2023-10-20 DIAGNOSIS — D649 Anemia, unspecified: Secondary | ICD-10-CM | POA: Insufficient documentation

## 2023-10-20 NOTE — Assessment & Plan Note (Signed)
She will return for additional  labs

## 2023-10-20 NOTE — Addendum Note (Signed)
Addended by: [name] on: 10/20/2023 09:58 PM   Modules accepted: Orders

## 2023-11-16 ENCOUNTER — Ambulatory Visit: Payer: BC Managed Care – PPO | Admitting: Internal Medicine

## 2024-02-13 ENCOUNTER — Other Ambulatory Visit: Payer: Self-pay | Admitting: Internal Medicine

## 2024-06-14 ENCOUNTER — Encounter: Payer: Self-pay | Admitting: Internal Medicine

## 2024-06-14 ENCOUNTER — Ambulatory Visit: Admitting: Internal Medicine

## 2024-06-14 VITALS — BP 140/92 | HR 102 | Ht 65.0 in | Wt 203.0 lb

## 2024-06-14 DIAGNOSIS — G8929 Other chronic pain: Secondary | ICD-10-CM

## 2024-06-14 DIAGNOSIS — R5383 Other fatigue: Secondary | ICD-10-CM

## 2024-06-14 DIAGNOSIS — R600 Localized edema: Secondary | ICD-10-CM | POA: Diagnosis not present

## 2024-06-14 DIAGNOSIS — E538 Deficiency of other specified B group vitamins: Secondary | ICD-10-CM

## 2024-06-14 DIAGNOSIS — E559 Vitamin D deficiency, unspecified: Secondary | ICD-10-CM

## 2024-06-14 DIAGNOSIS — H04123 Dry eye syndrome of bilateral lacrimal glands: Secondary | ICD-10-CM | POA: Insufficient documentation

## 2024-06-14 DIAGNOSIS — Z1231 Encounter for screening mammogram for malignant neoplasm of breast: Secondary | ICD-10-CM

## 2024-06-14 DIAGNOSIS — R7303 Prediabetes: Secondary | ICD-10-CM

## 2024-06-14 DIAGNOSIS — I10 Essential (primary) hypertension: Secondary | ICD-10-CM

## 2024-06-14 DIAGNOSIS — F5101 Primary insomnia: Secondary | ICD-10-CM

## 2024-06-14 DIAGNOSIS — M5416 Radiculopathy, lumbar region: Secondary | ICD-10-CM

## 2024-06-14 DIAGNOSIS — R682 Dry mouth, unspecified: Secondary | ICD-10-CM | POA: Insufficient documentation

## 2024-06-14 DIAGNOSIS — F411 Generalized anxiety disorder: Secondary | ICD-10-CM

## 2024-06-14 LAB — COMPREHENSIVE METABOLIC PANEL WITH GFR
ALT: 18 U/L (ref 0–35)
AST: 23 U/L (ref 0–37)
Albumin: 4.2 g/dL (ref 3.5–5.2)
Alkaline Phosphatase: 76 U/L (ref 39–117)
BUN: 10 mg/dL (ref 6–23)
CO2: 33 meq/L — ABNORMAL HIGH (ref 19–32)
Calcium: 9.1 mg/dL (ref 8.4–10.5)
Chloride: 104 meq/L (ref 96–112)
Creatinine, Ser: 0.58 mg/dL (ref 0.40–1.20)
GFR: 100.12 mL/min (ref >60.00)
Glucose, Bld: 89 mg/dL (ref 70–99)
Potassium: 4 meq/L (ref 3.5–5.1)
Sodium: 142 meq/L (ref 135–145)
Total Bilirubin: 0.3 mg/dL (ref 0.2–1.2)
Total Protein: 7 g/dL (ref 6.0–8.3)

## 2024-06-14 LAB — CBC WITH DIFFERENTIAL/PLATELET
Basophils Absolute: 0.1 10*3/uL (ref 0.0–0.1)
Basophils Relative: 1.5 % (ref 0.0–3.0)
Eosinophils Absolute: 0.1 10*3/uL (ref 0.0–0.7)
Eosinophils Relative: 1 % (ref 0.0–5.0)
HCT: 36.4 % (ref 36.0–46.0)
Hemoglobin: 11.9 g/dL — ABNORMAL LOW (ref 12.0–15.0)
Lymphocytes Relative: 36.7 % (ref 12.0–46.0)
Lymphs Abs: 1.8 10*3/uL (ref 0.7–4.0)
MCHC: 32.7 g/dL (ref 30.0–36.0)
MCV: 86 fl (ref 78.0–100.0)
Monocytes Absolute: 0.3 10*3/uL (ref 0.1–1.0)
Monocytes Relative: 5.5 % (ref 3.0–12.0)
Neutro Abs: 2.8 10*3/uL (ref 1.4–7.7)
Neutrophils Relative %: 55.3 % (ref 43.0–77.0)
Platelets: 196 10*3/uL (ref 150.0–400.0)
RBC: 4.23 Mil/uL (ref 3.87–5.11)
RDW: 14.9 % (ref 11.5–15.5)
WBC: 5 10*3/uL (ref 4.0–10.5)

## 2024-06-14 LAB — B12 AND FOLATE PANEL
Folate: >23.2 ng/mL (ref >5.9)
Vitamin B-12: 579 pg/mL (ref 211–911)

## 2024-06-14 LAB — HEMOGLOBIN A1C: Hgb A1c MFr Bld: 6 % (ref 4.6–6.5)

## 2024-06-14 LAB — TSH: TSH: 1.11 u[IU]/mL (ref 0.35–5.50)

## 2024-06-14 LAB — MICROALBUMIN / CREATININE URINE RATIO
Creatinine,U: 50.2 mg/dL
Microalb Creat Ratio: UNDETERMINED mg/g (ref 0.0–30.0)
Microalb, Ur: <0.7 mg/dL

## 2024-06-14 LAB — VITAMIN D 25 HYDROXY (VIT D DEFICIENCY, FRACTURES): VITD: 18.46 ng/mL — ABNORMAL LOW (ref 30.00–100.00)

## 2024-06-14 MED ORDER — LOSARTAN POTASSIUM-HCTZ 100-25 MG PO TABS: 1.0000 | ORAL_TABLET | Freq: Every day | ORAL | 1 refills | Status: DC

## 2024-06-14 NOTE — Progress Notes (Signed)
 Subjective:  Patient ID: Kelly [name], female    DOB: [birth year]  Age: 58 y.o. MRN: [id number]  CC: The primary encounter diagnosis was Encounter for screening mammogram for malignant neoplasm of breast. Diagnoses of Prediabetes, Vitamin D  deficiency, Vitamin B12 deficiency due to intestinal malabsorption, Dry mouth, Dry eyes, Localized edema, Other fatigue, Essential hypertension, Chronic right-sided low back pain without sciatica, Right lumbar radiculitis, Generalized anxiety disorder, Primary insomnia, and Fluid retention in legs were also pertinent to this visit.   HPI Kelly [name] presents for  Chief Complaint  Patient presents with   Medical Management of Chronic Issues    Cc: has been noticing a lot of lower extremity /ankle edema for the last several weeks,  with some weight gain of 5 lbs.  Does not use salt.  No orthopnea.  Has not used gabapentin  in the last several weeks..   2) fatigue  not sleeping well. Falls aseept wakes up 2 hours later wide awake .  No alcohol,    3) Hypertension: patient checks blood pressure twice weekly at home.  Readings have been for the most part <130/80 at rest . Patient is following a reduced salt diet most days and is taking medications as prescribed   4) OBESITY:  HISTORY OF GASTRIC BYPASS  WITH WEIGHT REGAIN OF 30 LBS after reading nadir of 170 lbs in 2020. Not exercising due to low back pain and son's health issues.   5) stress;  son diagnosed with ESKD at age 12   in February doing home dialysis (peritoneal)   Outpatient Medications Prior to Visit  Medication Sig Dispense Refill   LINZESS  145 MCG CAPS capsule Take 1 capsule (145 mcg total) by mouth daily. 30 capsule 5   Multiple Vitamin (MULTIVITAMIN) capsule Take 1 capsule by mouth daily.     ondansetron  (ZOFRAN -ODT) 4 MG disintegrating tablet Take 1 tablet (4 mg total) by mouth every 8 (eight) hours as needed for nausea or vomiting. 20 tablet 0   simethicone  (GAS-X) 80 MG  chewable tablet Chew 1 tablet (80 mg total) by mouth every 6 (six) hours as needed for flatulence. 30 tablet 0   Vitamin D , Ergocalciferol , (DRISDOL ) 1.25 MG (50000 UNIT) CAPS capsule TAKE 1 CAPSULE (50,000 UNITS TOTAL) BY MOUTH TWO TIMES A WEEK 24 capsule 0   gabapentin  (NEURONTIN ) 300 MG capsule Take 1 capsule (300 mg total) by mouth 2 (two) times daily. 90 capsule 1   losartan -hydrochlorothiazide (HYZAAR) 50-12.5 MG tablet Take 1 tablet by mouth daily. 90 tablet 1   estradiol  (CLIMARA  - DOSED IN MG/24 HR) 0.0375 mg/24hr patch PLACE 1 PATCH (0.0375 MG TOTAL) ONTO THE SKIN ONCE A WEEK. 4 patch 2   No facility-administered medications prior to visit.    Review of Systems;  Patient denies headache, fevers, malaise, unintentional weight loss, skin rash, eye pain, sinus congestion and sinus pain, sore throat, dysphagia,  hemoptysis , cough, dyspnea, wheezing, chest pain, palpitations, orthopnea, edema, abdominal pain, nausea, melena, diarrhea, constipation, flank pain, dysuria, hematuria, urinary  Frequency, nocturia, numbness, tingling, seizures,  Focal weakness, Loss of consciousness,  Tremor, insomnia, depression, anxiety, and suicidal ideation.      Objective:  BP (!) 140/92   Pulse (!) 102   Ht 5' 5 (1.651 m)   Wt 203 lb (92.1 kg)   SpO2 98%   BMI 33.78 kg/m   BP Readings from Last 3 Encounters:  06/14/24 (!) 140/92  10/19/23 122/80  07/17/23 (!) 160/96    Wt  Readings from Last 3 Encounters:  06/14/24 203 lb (92.1 kg)  10/19/23 203 lb 3.2 oz (92.2 kg)  07/17/23 203 lb 12.8 oz (92.4 kg)    Physical Exam Vitals reviewed.  Constitutional:      General: She is not in acute distress.    Appearance: Normal appearance. She is obese. She is not ill-appearing, toxic-appearing or diaphoretic.  HENT:     Head: Normocephalic.   Eyes:     General: No scleral icterus.       Right eye: No discharge.        Left eye: No discharge.     Conjunctiva/sclera: Conjunctivae normal.     Cardiovascular:     Rate and Rhythm: Normal rate and regular rhythm.     Heart sounds: Normal heart sounds.  Pulmonary:     Effort: Pulmonary effort is normal. No respiratory distress.     Breath sounds: Normal breath sounds.   Musculoskeletal:        General: Normal range of motion.     Right lower leg: Edema present.     Left lower leg: Edema present.   Skin:    General: Skin is warm and dry.     Comments: Trace pitting edema to lower 1/3 of both legs    Neurological:     General: No focal deficit present.     Mental Status: She is alert and oriented to person, place, and time. Mental status is at baseline.   Psychiatric:        Mood and Affect: Mood normal.        Behavior: Behavior normal.        Thought Content: Thought content normal.        Judgment: Judgment normal.    Lab Results  Component Value Date   HGBA1C 5.9 04/17/2023   HGBA1C 5.8 01/10/2022   HGBA1C 5.9 08/23/2021    Lab Results  Component Value Date   CREATININE 0.62 10/19/2023   CREATININE 0.58 07/17/2023   CREATININE 0.61 02/23/2023    Lab Results  Component Value Date   WBC 5.4 10/19/2023   HGB 11.7 (L) 10/19/2023   HCT 37.4 10/19/2023   PLT 210.0 10/19/2023   GLUCOSE 106 (H) 10/19/2023   CHOL 198 04/17/2023   TRIG 88.0 04/17/2023   HDL 73.70 04/17/2023   LDLDIRECT 114.0 04/17/2023   LDLCALC 107 (H) 04/17/2023   ALT 16 02/23/2023   AST 19 02/23/2023   NA 138 10/19/2023   K 3.9 10/19/2023   CL 103 10/19/2023   CREATININE 0.62 10/19/2023   BUN 11 10/19/2023   CO2 29 10/19/2023   TSH 1.02 04/17/2023   HGBA1C 5.9 04/17/2023   MICROALBUR 0.9 07/17/2023    NM Hepato W/Eject Fract Result Date: 05/04/2023 CLINICAL DATA:  Right upper quadrant abdominal pain, biliary disease suspected. EXAM: NUCLEAR MEDICINE HEPATOBILIARY IMAGING WITH GALLBLADDER EF TECHNIQUE: Sequential images of the abdomen were obtained out to 60 minutes following intravenous administration of radiopharmaceutical.  After oral ingestion of Ensure, gallbladder ejection fraction was determined. At 60 min, normal ejection fraction is greater than 33%. RADIOPHARMACEUTICALS:  5.33 mCi Tc-59m  Choletec  IV COMPARISON:  CT and ultrasound February 23, 2023 FINDINGS: Prompt uptake and biliary excretion of activity by the liver is seen. Gallbladder activity is visualized, consistent with patency of cystic duct. Biliary activity passes into small bowel, consistent with patent common bile duct. Calculated gallbladder ejection fraction is 84%. (Normal gallbladder ejection fraction with Ensure is greater than 33%.) IMPRESSION:  1.  Patent cystic and common bile ducts. 2.  Normal gallbladder ejection fraction. Electronically Signed   By: Reyes Holder M.D.   On: 05/04/2023 14:40    Assessment & Plan:  .Encounter for screening mammogram for malignant neoplasm of breast -     3D Screening Mammogram, Left and Right; Future  Prediabetes -     Hemoglobin A1c  Vitamin D  deficiency -     VITAMIN D  25 Hydroxy (Vit-D Deficiency, Fractures)  Vitamin B12 deficiency due to intestinal malabsorption Assessment & Plan: RECHECKING LEVEL DUE TO CC FATIGUE  Orders: -     B12 and Folate Panel  Dry mouth -     Sjogrens syndrome-A extractable nuclear antibody -     Sjogrens syndrome-B extractable nuclear antibody  Dry eyes Assessment & Plan: Screen for Sjogren's   Orders: -     Sjogrens syndrome-A extractable nuclear antibody -     Sjogrens syndrome-B extractable nuclear antibody  Localized edema -     Microalbumin / creatinine urine ratio  Other fatigue -     TSH -     Comprehensive metabolic panel with GFR -     CBC with Differential/Platelet  Essential hypertension Assessment & Plan: Not at goal based on home readings reported as well as in office readings.  Increase losartan  to 100/25   Orders: -     Lipid Panel w/reflex Direct LDL; Future  Chronic right-sided low back pain without sciatica  Right lumbar  radiculitis -     Ambulatory referral to Orthopedics  Generalized anxiety disorder Assessment & Plan: Recurrent , triggered by son's recent dagnosis of ESKD.   Defers treatment for now.    Primary insomnia Assessment & Plan: Trial of trazodone  recommended for early waking    Fluid retention in legs Assessment & Plan: Noted for hte last several weeks  not using gabapentin .  Will screen for nephrotic syndrome  thyroid  dysfunction   Other orders -     Losartan  Potassium-HCTZ; Take 1 tablet by mouth daily.  Dispense: 90 tablet; Refill: 1     I spent 40 minutes on the day of this face to face encounter reviewing patient's  most recent visit with bariatrics surgery,  orthopedics.   prior relevant surgical and non surgical procedures, recent  labs and imaging studies, counseling on weight management,  reviewing the assessment and plan with patient, and post visit ordering and reviewing of  diagnostics and therapeutics with patient  .   Follow-up: No follow-ups on file.   Kelly LITTIE Kettering, MD

## 2024-06-14 NOTE — Assessment & Plan Note (Signed)
 RECHECKING LEVEL DUE TO CC FATIGUE

## 2024-06-14 NOTE — Assessment & Plan Note (Signed)
 Recurrent , triggered by son's recent dagnosis of ESKD.   Defers treatment for now.

## 2024-06-14 NOTE — Assessment & Plan Note (Signed)
 Screen for Sjogren's

## 2024-06-14 NOTE — Assessment & Plan Note (Signed)
 Trial of trazodone  recommended for early waking

## 2024-06-14 NOTE — Assessment & Plan Note (Signed)
 Noted for hte last several weeks  not using gabapentin .  Will screen for nephrotic syndrome  thyroid  dysfunction

## 2024-06-14 NOTE — Patient Instructions (Addendum)
 REFERRAL TO [facility]   I HAVE CHANGED YOUR LOSARTAN /HCT DOSE BECAUSE YOUR BP IS TOO HIGH.  YOU CAN DOUBLE YOUR CURRENT DOSE UNTIL YOU PICK UP THE NEW PRESCRIPTION

## 2024-06-14 NOTE — Assessment & Plan Note (Signed)
 Not at goal based on home readings reported as well as in office readings.  Increase losartan  to 100/25

## 2024-06-15 LAB — SJOGRENS SYNDROME-B EXTRACTABLE NUCLEAR ANTIBODY: SSB (La) (ENA) Antibody, IgG: <1 AI

## 2024-06-15 LAB — SJOGRENS SYNDROME-A EXTRACTABLE NUCLEAR ANTIBODY: SSA (Ro) (ENA) Antibody, IgG: <1 AI

## 2024-06-16 ENCOUNTER — Ambulatory Visit: Payer: Self-pay | Admitting: Internal Medicine

## 2024-06-16 MED ORDER — VITAMIN D (ERGOCALCIFEROL) 1.25 MG (50000 UNIT) PO CAPS: ORAL_CAPSULE | ORAL | Status: AC

## 2024-06-16 NOTE — Assessment & Plan Note (Signed)
 Recurrent,  Level was very low despite weekly use of 50,000.  Secondary to bariatric surgery  Increase to 2/weekly

## 2024-06-16 NOTE — Addendum Note (Signed)
 Addended by: MARYLYNN VERNEITA CROME on: 06/16/2024 05:51 PM   Modules accepted: Orders

## 2024-06-20 ENCOUNTER — Other Ambulatory Visit: Payer: Self-pay | Admitting: Internal Medicine

## 2024-06-22 NOTE — Telephone Encounter (Signed)
 Spoke with pt to verify that she was not using this medication. Pt stated that she is not. Please refuse the request.

## 2024-07-05 ENCOUNTER — Ambulatory Visit: Admitting: Internal Medicine

## 2024-07-26 ENCOUNTER — Ambulatory Visit
Admission: RE | Admit: 2024-07-26 | Discharge: 2024-07-26 | Disposition: A | Discharge status: Home / Self Care | Source: Ambulatory Visit | Attending: Internal Medicine | Admitting: Internal Medicine

## 2024-07-26 DIAGNOSIS — Z1231 Encounter for screening mammogram for malignant neoplasm of breast: Secondary | ICD-10-CM | POA: Diagnosis present

## 2024-08-14 ENCOUNTER — Ambulatory Visit
Admission: RE | Admit: 2024-08-14 | Discharge: 2024-08-14 | Disposition: A | Discharge status: Home / Self Care | Source: Ambulatory Visit | Attending: Emergency Medicine | Admitting: Emergency Medicine

## 2024-08-14 VITALS — BP 131/86 | HR 73 | Temp 98.4°F | Resp 14 | Ht 65.0 in | Wt 203.0 lb

## 2024-08-14 DIAGNOSIS — U071 COVID-19: Secondary | ICD-10-CM | POA: Insufficient documentation

## 2024-08-14 LAB — RESP PANEL BY RT-PCR (FLU A&B, COVID) ARPGX2
Influenza A by PCR: NEGATIVE
Influenza B by PCR: NEGATIVE
SARS Coronavirus 2 by RT PCR: POSITIVE — AB

## 2024-08-14 MED ORDER — IPRATROPIUM BROMIDE 0.06 % NA SOLN: 2.0000 | Freq: Four times a day (QID) | NASAL | 12 refills | Status: AC

## 2024-08-14 MED ORDER — BENZONATATE 100 MG PO CAPS
200.0000 mg | ORAL_CAPSULE | Freq: Three times a day (TID) | ORAL | 0 refills | Status: AC
Start: 2024-08-14 — End: ?

## 2024-08-14 MED ORDER — AEROCHAMBER MV MISC: 2 refills | Status: AC

## 2024-08-14 MED ORDER — ALBUTEROL SULFATE HFA 108 (90 BASE) MCG/ACT IN AERS: 2.0000 | INHALATION_SPRAY | RESPIRATORY_TRACT | 0 refills | Status: AC | PRN

## 2024-08-14 MED ORDER — PROMETHAZINE-DM 6.25-15 MG/5ML PO SYRP: 5.0000 mL | ORAL_SOLUTION | Freq: Four times a day (QID) | ORAL | 0 refills | Status: AC | PRN

## 2024-08-14 NOTE — ED Provider Notes (Signed)
 [facility]    CSN: [id number] Arrival date & time: 08/14/24  1044      History   Chief Complaint Chief Complaint  Patient presents with   Cough         HPI Kelly BOLLS is a 58 y.o. female.   HPI  58 year old female with past medical history significant for anemia, GERD, anxiety, IBS, essential hypertension, migraine headaches without aura, and vitamin B-12 and D deficiencies presents for evaluation of respiratory symptoms that been going on for the past week and worsened over the last 2 days.  She is reporting a subjective fever, chills, of her nasal discharge, scratchy throat, nonproductive cough, and intermittent shortness of breath.  Her husband presented with similar symptoms several days prior.  She denies any ear pain, wheezing, or recent travel.  Past Medical History:  Diagnosis Date   Chest pain 2002   abnormal EKG, negative treadmill stress test/Holter myoview, Dr. Fernand    Patient Active Problem List   Diagnosis Date Noted   Dry eyes 06/14/2024   Dry mouth 06/14/2024   Anemia 10/20/2023   Cough in adult 10/19/2023   Weight gain following gastric bypass surgery 04/17/2023   Menopause syndrome 02/25/2022   Anxiety 08/23/2021   Fluid retention in legs 05/12/2020   Postsurgical dumping syndrome 11/12/2019   Breast tenderness in female 11/12/2019   Eczema of external ear 11/12/2019   Bilateral hand pain 01/04/2019   Impingement syndrome of shoulder region 10/16/2017   Osteoarthritis of knee 10/16/2017   Knee pain, bilateral 10/03/2017   Insomnia 10/03/2017   Chest pain at rest 07/23/2017   Vitamin B12 deficiency due to intestinal malabsorption 11/06/2016   Vitamin D  deficiency 11/06/2016   S/P bariatric surgery 11/06/2016   Postprandial RUQ pain 03/25/2016   Postoperative malabsorption 03/04/2016   Impaired fasting glucose 06/18/2015   GERD (gastroesophageal reflux disease) 06/17/2015   Right lumbar radiculitis 05/30/2015   Essential  hypertension 10/21/2014   Encounter for general adult medical examination with abnormal findings 09/19/2014   S/P hysterectomy with oophorectomy 09/19/2014   Absence of both cervix and uterus, acquired 09/19/2014   Generalized anxiety disorder 09/18/2014   Snoring 09/18/2014   Irritable bowel syndrome 11/11/2013   Shoulder pain, right 11/11/2013   Menopause ovarian failure 09/13/2012   Migraine headache without aura 07/27/2012   Other chronic allergic conjunctivitis 07/27/2012   Chronic right-sided low back pain without sciatica 12/31/2011   Obesity (BMI 30.0-34.9) 12/31/2011    Past Surgical History:  Procedure Laterality Date   ABDOMINAL HYSTERECTOMY  35   secondary to post partum hemorrhage causing anemia   GASTRIC BYPASS  2016   SPINE SURGERY     lumbar spine, L4-L5 ruptured disc    OB History   No obstetric history on file.      Home Medications    Prior to Admission medications   Medication Sig Start Date End Date Taking? Authorizing Provider  albuterol  (VENTOLIN  HFA) 108 (90 Base) MCG/ACT inhaler Inhale 2 puffs into the lungs every 4 (four) hours as needed. 08/14/24  Yes Bernardino Ditch, NP  benzonatate  (TESSALON ) 100 MG capsule Take 2 capsules (200 mg total) by mouth every 8 (eight) hours. 08/14/24  Yes Bernardino Ditch, NP  ipratropium (ATROVENT ) 0.06 % nasal spray Place 2 sprays into both nostrils 4 (four) times daily. 08/14/24  Yes Bernardino Ditch, NP  losartan -hydrochlorothiazide (HYZAAR) 100-25 MG tablet Take 1 tablet by mouth daily. 06/14/24  Yes Marylynn Verneita CROME, MD  Multiple Vitamin (  MULTIVITAMIN) capsule Take 1 capsule by mouth daily.   Yes [provider]  promethazine -dextromethorphan (PROMETHAZINE -DM) 6.25-15 MG/5ML syrup Take 5 mLs by mouth 4 (four) times daily as needed. 08/14/24  Yes Bernardino Ditch, NP  Spacer/Aero-Holding Rag[place] (AEROCHAMBER MV) inhaler Use as instructed 08/14/24  Yes Bernardino Ditch, NP  Vitamin D , Ergocalciferol , (DRISDOL ) 1.25 MG (50000 UNIT)  CAPS capsule TAKE 1 CAPSULE (50,000 UNITS TOTAL) BY MOUTH TWO TIMES A WEEK 06/16/24  Yes Marylynn Verneita CROME, MD  ondansetron  (ZOFRAN -ODT) 4 MG disintegrating tablet Take 1 tablet (4 mg total) by mouth every 8 (eight) hours as needed for nausea or vomiting. 02/23/23   Small, Brooke L, PA  simethicone  (GAS-X) 80 MG chewable tablet Chew 1 tablet (80 mg total) by mouth every 6 (six) hours as needed for flatulence. 02/23/23   Kriste Berth, DO    Family History Family History  Problem Relation Age of Onset   Hyperlipidemia Mother    Arthritis Mother    Hypertension Mother    Hypertension Father    Heart disease Father    Heart disease Maternal Uncle    Coronary artery disease Maternal Uncle    Diabetes Paternal Aunt    Coronary artery disease Maternal Grandfather    Heart attack Maternal Grandfather    Breast cancer Maternal Aunt 45   Colon cancer Maternal Grandmother    Heart disease Paternal Grandmother    Diabetes Brother    Hyperlipidemia Brother    Hypertension Brother    Heart disease Brother     Social History Social History   Tobacco Use   Smoking status: Never    Passive exposure: Never   Smokeless tobacco: Never  Vaping Use   Vaping status: Former   Quit date: 07/23/2016  Substance Use Topics   Alcohol use: Not Currently    Alcohol/week: 0.0 standard drinks of alcohol   Drug use: No     Allergies   Codeine and Latex   Review of Systems Review of Systems  Constitutional:  Positive for chills and fever.  HENT:  Positive for congestion, rhinorrhea and sore throat. Negative for ear pain.   Respiratory:  Positive for cough and shortness of breath. Negative for wheezing.   Neurological:  Positive for headaches.     Physical Exam Triage Vital Signs ED Triage Vitals  Encounter Vitals Group     BP      Girls Systolic BP Percentile      Girls Diastolic BP Percentile      Boys Systolic BP Percentile      Boys Diastolic BP Percentile      Pulse      Resp      Temp       Temp src      SpO2      Weight      Height      Head Circumference      Peak Flow      Pain Score      Pain Loc      Pain Education      Exclude from Growth Chart    No data found.  Updated Vital Signs BP 131/86 (BP Location: Right Arm)   Pulse 73   Temp 98.4 F (36.9 C) (Oral)   Resp 14   Ht 5' 5 (1.651 m)   Wt 203 lb 0.7 oz (92.1 kg)   SpO2 95%   BMI 33.79 kg/m   Visual Acuity Right Eye Distance:   Left Eye Distance:  Bilateral Distance:    Right Eye Near:   Left Eye Near:    Bilateral Near:     Physical Exam Vitals and nursing note reviewed.  Constitutional:      Appearance: Normal appearance. She is not ill-appearing.  HENT:     Head: Normocephalic and atraumatic.     Right Ear: Tympanic membrane, ear canal and external ear normal. There is no impacted cerumen.     Left Ear: Tympanic membrane, ear canal and external ear normal. There is no impacted cerumen.     Ears:     Comments: Patient has cerumen in both external auditory canals but I am able to visualize both tympanic membranes which are pearly gray in appearance.    Nose: Congestion and rhinorrhea present.     Comments: Nasal mucosa is edematous and erythematous with clear discharge in both nares.    Mouth/Throat:     Mouth: Mucous membranes are moist.     Pharynx: Oropharynx is clear. Posterior oropharyngeal erythema present. No oropharyngeal exudate.     Comments: Erythema and injection to the posterior pharynx with clear postnasal drip. Cardiovascular:     Rate and Rhythm: Normal rate and regular rhythm.     Pulses: Normal pulses.     Heart sounds: Normal heart sounds. No murmur heard.    No friction rub. No gallop.  Pulmonary:     Effort: Pulmonary effort is normal.     Breath sounds: Normal breath sounds. No wheezing, rhonchi or rales.  Musculoskeletal:     Cervical back: Normal range of motion and neck supple. No tenderness.  Lymphadenopathy:     Cervical: No cervical adenopathy.   Skin:    General: Skin is warm and dry.     Capillary Refill: Capillary refill takes less than 2 seconds.     Findings: No erythema or rash.  Neurological:     General: No focal deficit present.     Mental Status: She is alert and oriented to person, place, and time.      UC Treatments / Results  Labs (all labs ordered are listed, but only abnormal results are displayed) Labs Reviewed  RESP PANEL BY RT-PCR (FLU A&B, COVID) ARPGX2 - Abnormal; Notable for the following components:      Result Value   SARS Coronavirus 2 by RT PCR POSITIVE (*)    All other components within normal limits    EKG   Radiology No results found.  Procedures Procedures (including critical care time)  Medications Ordered in UC Medications - No data to display  Initial Impression / Assessment and Plan / UC Course  I have reviewed the triage vital signs and the nursing notes.  Pertinent labs & imaging results that were available during my care of the patient were reviewed by me and considered in my medical decision making (see chart for details).   Patient is a pleasant, nontoxic-appearing 58 year old female presenting for evaluation of respiratory symptoms as outlined in the HPI above.  She is endorsing intermittent shortness of breath but she is not experiencing shortness of breath at present.  She is able to speak in full sentence without dyspnea or tachypnea.  Respiratory rate at triage is 14 with a 95% room room air oxygen saturation.  She is afebrile with oral temp of 98.4.  Differential diagnose include COVID, influenza, viral respiratory illness.  I will order a COVID and flu PCR.  Respiratory panel is positive for COVID.  I will discharge patient home with  a diagnosis of COVID-19.  I will prescribe an albuterol  inhaler that she can use as needed for shortness of breath and wheezing along with Atrovent  nasal spray for the nasal congestion.  Tessalon  Perles of breath and cough syrup for cough  and congestion.  ER precautions reviewed.   Final Clinical Impressions(s) / UC Diagnoses   Final diagnoses:  COVID-19     Discharge Instructions      CDC guidelines state that you must wear a mask for the first 5 days of symptoms when you are around other people.  After 5 days you no longer need to mask as you are no longer considered infectious.  There is no longer need to quarantine unless you have a fever.  If you do have a fever then you need to quarantine until you have been fever free for 24 hours without taking Tylenol  and/or ibuprofen.  Use over-the-counter Tylenol  and/or ibuprofen according to the package instructions as needed for fever and pain.  Use the Atrovent  nasal spray, 2 squirts up each nostril every 6 hours, as needed for nasal congestion and runny nose.  Use the Tessalon  Perles every 8 hours during the day as needed for cough.  Take them with a small sip of water.  You may experience numbness to the base of your tongue or metallic taste in her mouth, this is normal.  Use the Promethazine  DM cough syrup at bedtime as needed for cough and congestion.  Be mindful this medication will make you sleepy.  Use the albuterol  inhaler with the spacer and take 1 to 2 puffs every 4-6 hours as needed for shortness of breath or if you develop wheezing.  If you develop any worsening respiratory symptoms such as shortness of breath, shortness of breath at rest, feel as though you cannot catch your breath, you are unable to speak in full sentences, or, as a late sign, your lips begin turning blue you need to call 911 and go to the ER for evaluation.      ED Prescriptions     Medication Sig Dispense Auth. Provider   Spacer/Aero-Holding Chambers (AEROCHAMBER MV) inhaler Use as instructed 1 each Bernardino Ditch, NP   albuterol  (VENTOLIN  HFA) 108 (90 Base) MCG/ACT inhaler Inhale 2 puffs into the lungs every 4 (four) hours as needed. 18 g Bernardino Ditch, NP   benzonatate  (TESSALON ) 100 MG  capsule Take 2 capsules (200 mg total) by mouth every 8 (eight) hours. 21 capsule Bernardino Ditch, NP   ipratropium (ATROVENT ) 0.06 % nasal spray Place 2 sprays into both nostrils 4 (four) times daily. 15 mL Bernardino Ditch, NP   promethazine -dextromethorphan (PROMETHAZINE -DM) 6.25-15 MG/5ML syrup Take 5 mLs by mouth 4 (four) times daily as needed. 118 mL Bernardino Ditch, NP      PDMP not reviewed this encounter.   Bernardino Ditch, NP 08/14/24 1143

## 2024-08-14 NOTE — ED Triage Notes (Signed)
 Patient c/o cough and chest congestion, headache and nasal congestion that started a week ago but got worse on Friday.  Patient unsure of fevers. Patient has been taking Tylenol .

## 2024-08-14 NOTE — Discharge Instructions (Addendum)
 CDC guidelines state that you must wear a mask for the first 5 days of symptoms when you are around other people.  After 5 days you no longer need to mask as you are no longer considered infectious.  There is no longer need to quarantine unless you have a fever.  If you do have a fever then you need to quarantine until you have been fever free for 24 hours without taking Tylenol  and/or ibuprofen.  Use over-the-counter Tylenol  and/or ibuprofen according to the package instructions as needed for fever and pain.  Use the Atrovent  nasal spray, 2 squirts up each nostril every 6 hours, as needed for nasal congestion and runny nose.  Use the Tessalon  Perles every 8 hours during the day as needed for cough.  Take them with a small sip of water.  You may experience numbness to the base of your tongue or metallic taste in her mouth, this is normal.  Use the Promethazine  DM cough syrup at bedtime as needed for cough and congestion.  Be mindful this medication will make you sleepy.  Use the albuterol  inhaler with the spacer and take 1 to 2 puffs every 4-6 hours as needed for shortness of breath or if you develop wheezing.  If you develop any worsening respiratory symptoms such as shortness of breath, shortness of breath at rest, feel as though you cannot catch your breath, you are unable to speak in full sentences, or, as a late sign, your lips begin turning blue you need to call 911 and go to the ER for evaluation.

## 2024-08-24 LAB — HM DEXA SCAN: HM Dexa Scan: NORMAL

## 2025-01-14 ENCOUNTER — Other Ambulatory Visit: Payer: Self-pay | Admitting: Internal Medicine
